# Patient Record
Sex: Male | Born: 1945 | Race: Black or African American | Hispanic: No | Marital: Married | Smoking: Current every day smoker
Health system: Southern US, Community
[De-identification: ages and names within clinical notes are randomized; demographics above are authoritative.]

## PROBLEM LIST (undated history)

## (undated) DIAGNOSIS — C801 Malignant (primary) neoplasm, unspecified: Secondary | ICD-10-CM

## (undated) DIAGNOSIS — I351 Nonrheumatic aortic (valve) insufficiency: Secondary | ICD-10-CM

## (undated) DIAGNOSIS — I639 Cerebral infarction, unspecified: Secondary | ICD-10-CM

## (undated) DIAGNOSIS — N189 Chronic kidney disease, unspecified: Secondary | ICD-10-CM

## (undated) DIAGNOSIS — D151 Benign neoplasm of heart: Secondary | ICD-10-CM

## (undated) DIAGNOSIS — E785 Hyperlipidemia, unspecified: Secondary | ICD-10-CM

## (undated) DIAGNOSIS — R079 Chest pain, unspecified: Secondary | ICD-10-CM

## (undated) DIAGNOSIS — K635 Polyp of colon: Secondary | ICD-10-CM

## (undated) DIAGNOSIS — E7800 Pure hypercholesterolemia, unspecified: Secondary | ICD-10-CM

## (undated) DIAGNOSIS — R011 Cardiac murmur, unspecified: Secondary | ICD-10-CM

## (undated) DIAGNOSIS — I00 Rheumatic fever without heart involvement: Secondary | ICD-10-CM

## (undated) DIAGNOSIS — I359 Nonrheumatic aortic valve disorder, unspecified: Secondary | ICD-10-CM

## (undated) DIAGNOSIS — M549 Dorsalgia, unspecified: Secondary | ICD-10-CM

## (undated) HISTORY — DX: Chest pain, unspecified: R07.9

## (undated) HISTORY — PX: OTHER SURGICAL HISTORY: SHX169

## (undated) HISTORY — DX: Dorsalgia, unspecified: M54.9

## (undated) HISTORY — DX: Pure hypercholesterolemia, unspecified: E78.00

## (undated) HISTORY — DX: Benign neoplasm of heart: D15.1

## (undated) HISTORY — PX: ESOPHAGOGASTRODUODENOSCOPY: SHX1529

## (undated) HISTORY — DX: Cerebral infarction, unspecified: I63.9

## (undated) HISTORY — DX: Hyperlipidemia, unspecified: E78.5

## (undated) HISTORY — DX: Nonrheumatic aortic valve disorder, unspecified: I35.9

---

## 1989-10-20 DIAGNOSIS — I639 Cerebral infarction, unspecified: Secondary | ICD-10-CM

## 1989-10-20 HISTORY — DX: Cerebral infarction, unspecified: I63.9

## 2001-02-08 ENCOUNTER — Encounter (INDEPENDENT_AMBULATORY_CARE_PROVIDER_SITE_OTHER): Payer: Self-pay | Admitting: Specialist

## 2001-02-08 ENCOUNTER — Other Ambulatory Visit: Admission: RE | Admit: 2001-02-08 | Discharge: 2001-02-08 | Payer: Self-pay | Admitting: Urology

## 2001-04-03 ENCOUNTER — Emergency Department (HOSPITAL_COMMUNITY): Admission: EM | Admit: 2001-04-03 | Discharge: 2001-04-03 | Payer: Self-pay

## 2005-06-24 ENCOUNTER — Emergency Department (HOSPITAL_COMMUNITY): Admission: EM | Admit: 2005-06-24 | Discharge: 2005-06-25 | Payer: Self-pay | Admitting: Emergency Medicine

## 2005-06-24 ENCOUNTER — Encounter (INDEPENDENT_AMBULATORY_CARE_PROVIDER_SITE_OTHER): Payer: Self-pay | Admitting: *Deleted

## 2005-06-24 ENCOUNTER — Ambulatory Visit (HOSPITAL_COMMUNITY): Admission: RE | Admit: 2005-06-24 | Discharge: 2005-06-24 | Payer: Self-pay | Admitting: Urology

## 2005-06-24 ENCOUNTER — Ambulatory Visit (HOSPITAL_BASED_OUTPATIENT_CLINIC_OR_DEPARTMENT_OTHER): Admission: RE | Admit: 2005-06-24 | Discharge: 2005-06-24 | Payer: Self-pay | Admitting: Urology

## 2005-10-30 ENCOUNTER — Ambulatory Visit (HOSPITAL_COMMUNITY): Admission: RE | Admit: 2005-10-30 | Discharge: 2005-10-30 | Payer: Self-pay | Admitting: Urology

## 2005-12-23 ENCOUNTER — Encounter (INDEPENDENT_AMBULATORY_CARE_PROVIDER_SITE_OTHER): Payer: Self-pay | Admitting: Specialist

## 2005-12-23 ENCOUNTER — Ambulatory Visit (HOSPITAL_BASED_OUTPATIENT_CLINIC_OR_DEPARTMENT_OTHER): Admission: RE | Admit: 2005-12-23 | Discharge: 2005-12-23 | Payer: Self-pay | Admitting: Urology

## 2008-07-07 ENCOUNTER — Encounter: Admission: RE | Admit: 2008-07-07 | Discharge: 2008-07-07 | Payer: Self-pay | Admitting: Neurology

## 2008-07-24 ENCOUNTER — Encounter: Payer: Self-pay | Admitting: Emergency Medicine

## 2008-07-24 ENCOUNTER — Observation Stay (HOSPITAL_COMMUNITY): Admission: AD | Admit: 2008-07-24 | Discharge: 2008-07-25 | Payer: Self-pay | Admitting: Internal Medicine

## 2008-08-24 ENCOUNTER — Encounter (INDEPENDENT_AMBULATORY_CARE_PROVIDER_SITE_OTHER): Payer: Self-pay | Admitting: Urology

## 2008-08-24 ENCOUNTER — Ambulatory Visit (HOSPITAL_COMMUNITY): Admission: RE | Admit: 2008-08-24 | Discharge: 2008-08-25 | Payer: Self-pay | Admitting: Urology

## 2008-12-27 IMAGING — CT CT ANGIO CHEST
1 of 3 series · 19 of 32 positions shown · IV contrast (APPLIED)
Comparison: None.

CTA CHEST

CLINICAL DATA: Chest and back pain.  Evaluate for aortic
dissection.

CT ANGIOGRAPHY CHEST AND ABDOMEN
TECHNIQUE: Multidetector CT imaging of the chest and abdomen was
performed using the standard protocol during bolus administration
of intravenous contrast. Multiplanar CT image reconstructions
including MIPs were obtained to evaluate the vascular anatomy.
Contrast: 100 ml [id number]

[Series 7: ?aortic dissecti 1.0 b20f st · axial · 0.69mm/px · z∈[-418,-28]mm · 19 of 434 slices shown]
[im 22/434  lung]
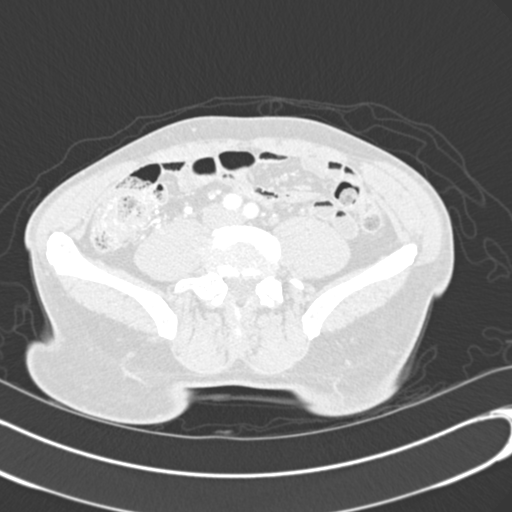
[im 44/434  soft-tissue]
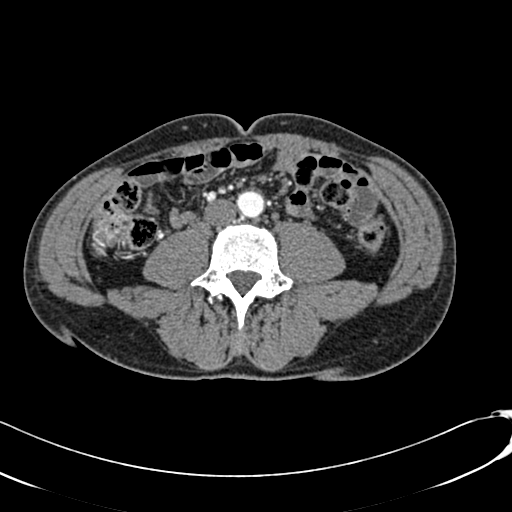
[im 65/434  lung]
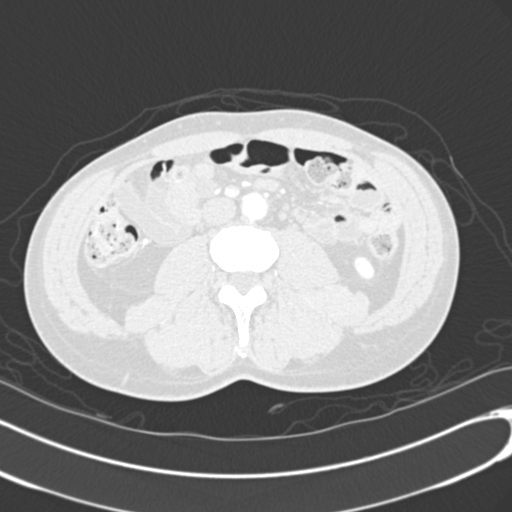
[im 87/434  soft-tissue]
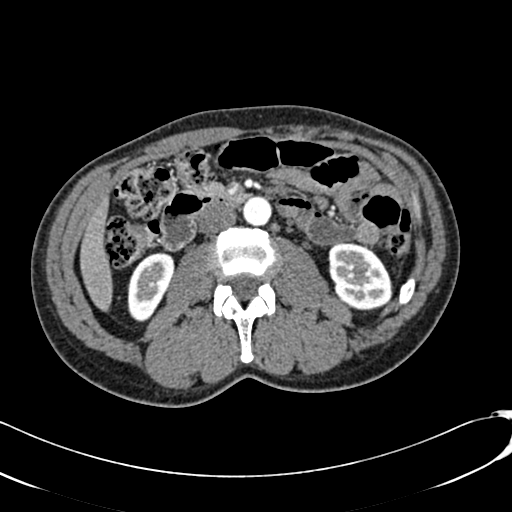
[im 109/434  lung]
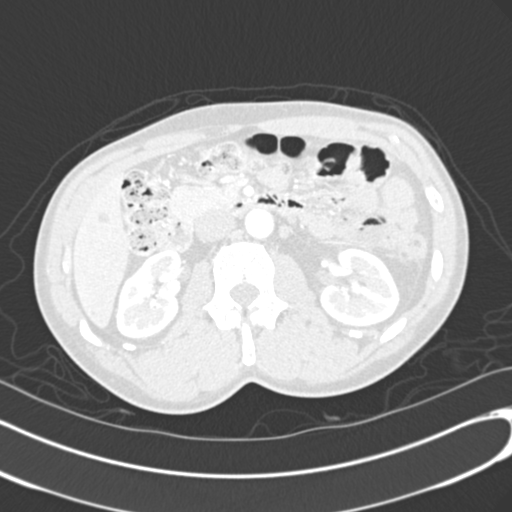
[im 130/434  soft-tissue]
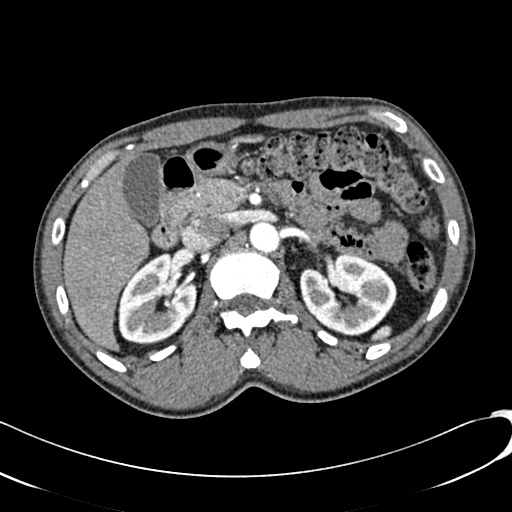
[im 152/434  lung]
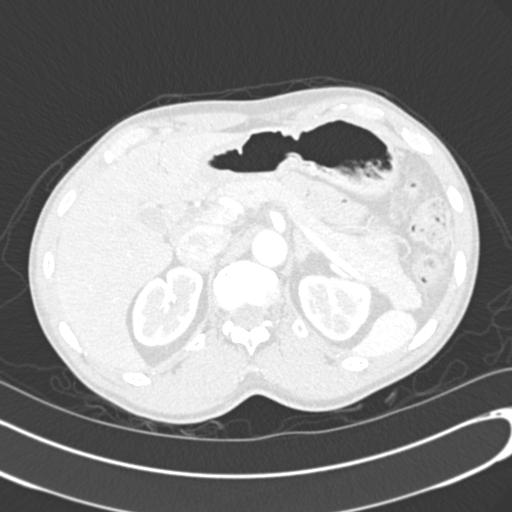
[im 174/434  soft-tissue]
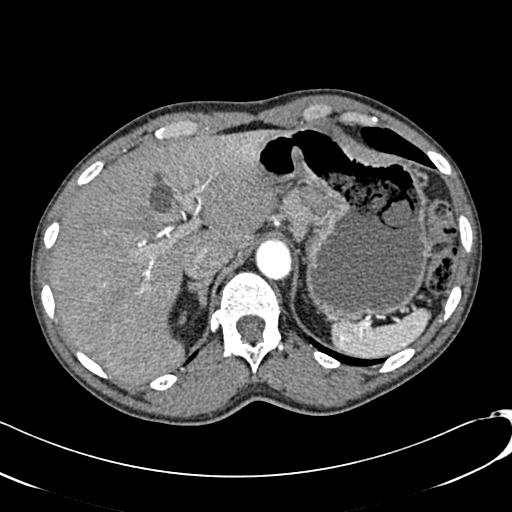
[im 195/434  lung]
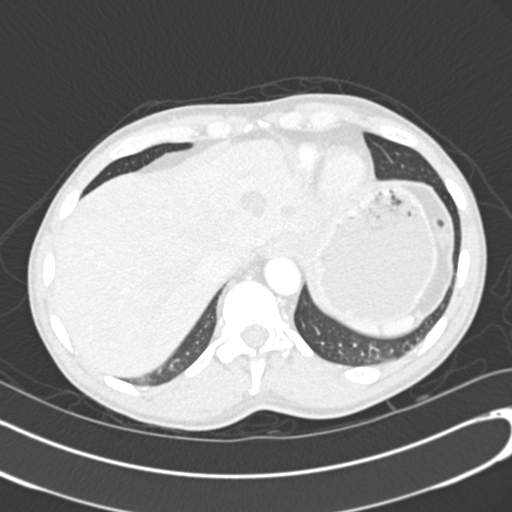
[im 217/434  soft-tissue]
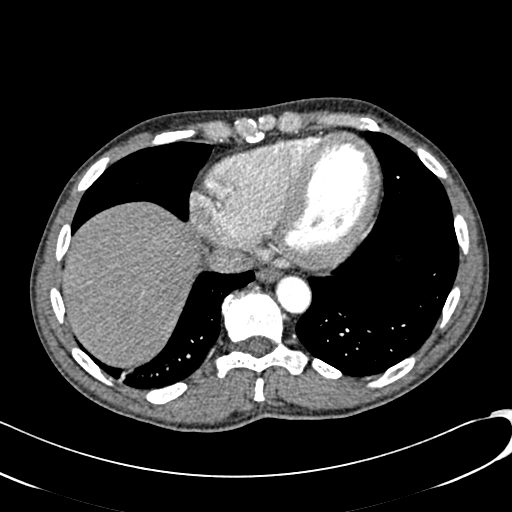
[im 239/434  lung]
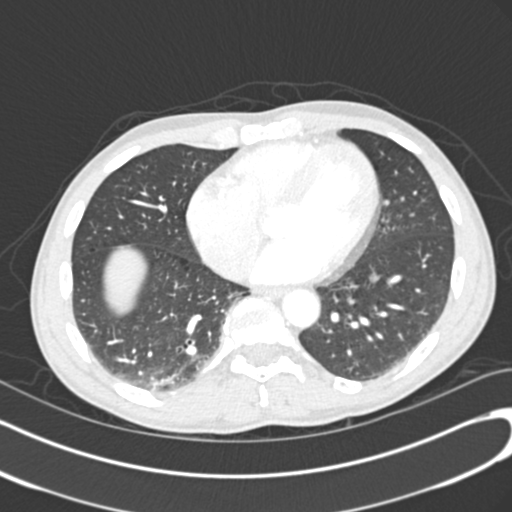
[im 260/434  soft-tissue]
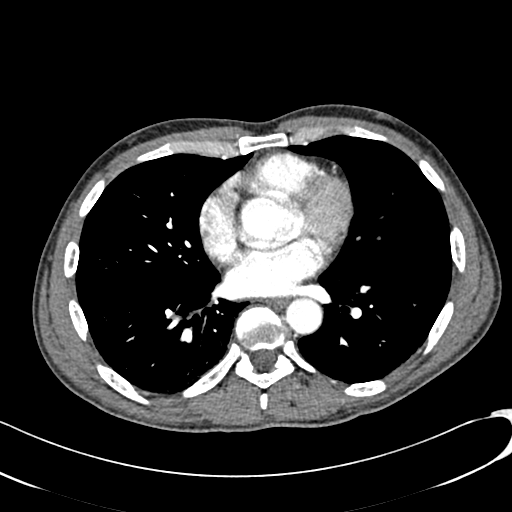
[im 282/434  lung]
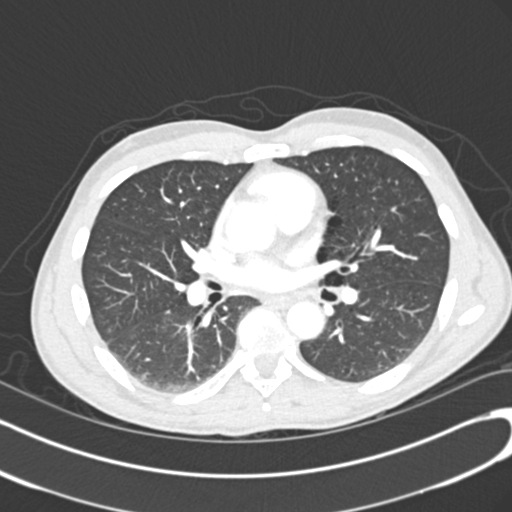
[im 304/434  soft-tissue]
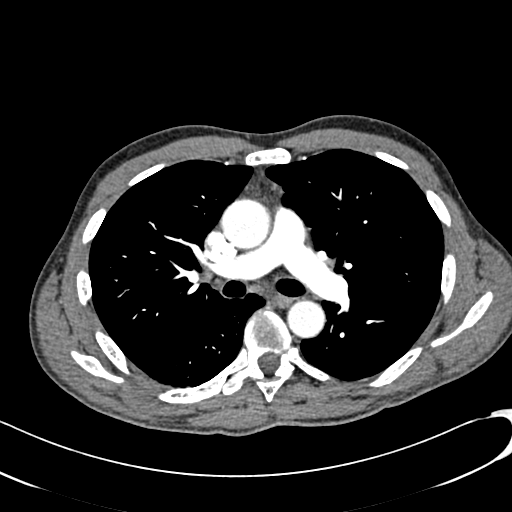
[im 325/434  lung]
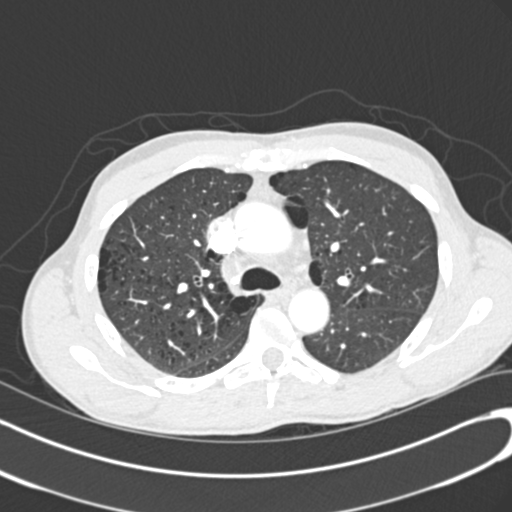
[im 347/434  soft-tissue]
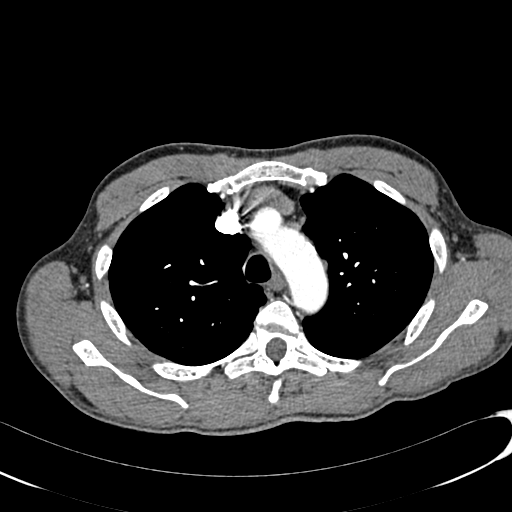
[im 369/434  lung]
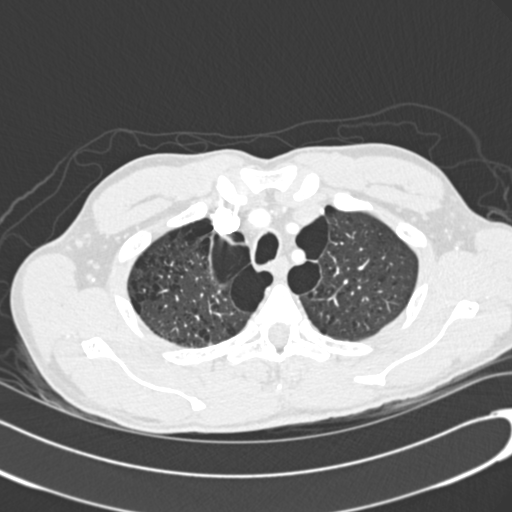
[im 390/434  soft-tissue]
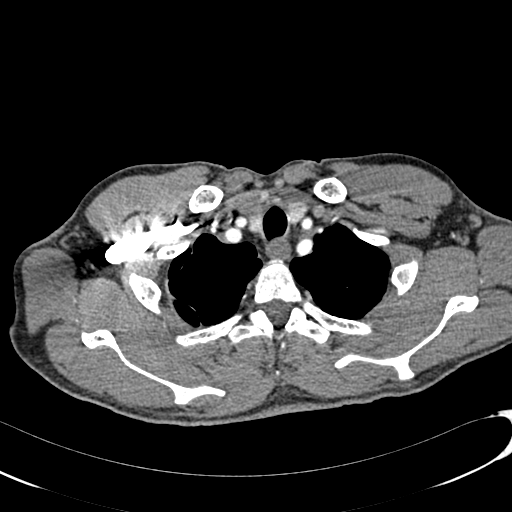
[im 412/434  lung]
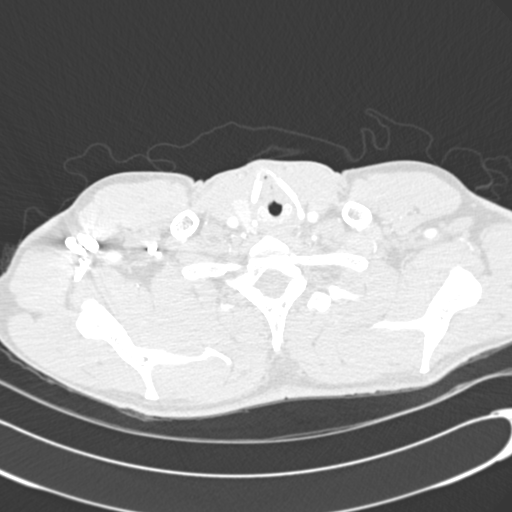

[19 of 32 positions shown; findings below may reference images not displayed]

FINDINGS: No evidence of aortic dissection.  No pathologically
enlarged mediastinal, hilar or axillary lymph nodes.  There are
calcified lymph nodes in the mediastinum.  Heart size within normal
limits.  No pericardial effusion.

Bullous emphysema.  Atelectasis and/or scarring in the right lower
lobe.  There is probable mucous within a bleb in the medial right
apex (image 27).  Lungs otherwise clear.  No pleural fluid.  Airway
unremarkable.
IMPRESSION: 1.  No evidence of aortic dissection.
2.  Bullous emphysema.

CTA ABDOMEN
FINDINGS: No evidence of aortic dissection or aneurysm.  Mild
atherosclerotic calcification is noted.

There are several well circumscribed low attenuation lesions in the
liver, measuring up to 1.9 x 1.7 cm.  These are likely cysts, in
the absence of known malignancy.  Right adrenal gland unremarkable.
Question mild thickening of the left adrenal gland.  There may be a
nonobstructing stone in the upper pole of the right kidney, which
is otherwise unremarkable.  Low attenuation lesion in the upper
pole of the left kidney measures 1.5 cm and is likely a cyst.
Spleen, pancreas and visualized bowel are unremarkable.

No pathologically enlarged lymph nodes.  No free fluid.  No
worrisome lytic or sclerotic lesions.
IMPRESSION: 1.  No evidence of aortic dissection.
2.  Question nonobstructing right renal stone.

## 2009-03-09 ENCOUNTER — Inpatient Hospital Stay (HOSPITAL_BASED_OUTPATIENT_CLINIC_OR_DEPARTMENT_OTHER): Admission: RE | Admit: 2009-03-09 | Discharge: 2009-03-09 | Payer: Self-pay | Admitting: Cardiology

## 2011-03-04 NOTE — Cardiovascular Report (Signed)
NAME:  Ricky Wagner, TOMB NO.:  [id number]   MEDICAL RECORD NO.:  [id number]          PATIENT TYPE:  OIB   LOCATION:  1963                         FACILITY:  [facility]   PHYSICIAN:  Jake Bathe, MD      DATE OF BIRTH:  1946-05-01   DATE OF PROCEDURE:  03/09/2009  DATE OF DISCHARGE:                            CARDIAC CATHETERIZATION   PROCEDURE:  1. Left heart catheterization.  2. Selective coronary angiography.  3. Left ventriculogram.   INDICATIONS:  Abnormal nuclear stress test.  Briefly, a 65 year old male  with abnormal nuclear stress test showing mild anterior septal wall  ischemia who has had a prior stroke on chronic Coumadin therapy (has  been held for 5 days) with hyperlipidemia, mild-to-moderate aortic  insufficiency, normal ejection fraction, exercise time 7 minutes and 45  seconds, here for cardiac catheterization.   PROCEDURE DETAILS:  Informed consent obtained.  Risk of stroke, heart  attack, death, renal impairment, bleeding, or arterial or venous damage  explained to the patient at length.  He was placed on the  catheterization table, prepped in a sterile fashion.  Lidocaine 1% was  used for local anesthesia.  Using the modified Seldinger technique, a 4-  French sheath was placed into the right femoral artery.  A Judkins left  #4 catheter and a no-torque Williams right catheter were used to  selectively cannulate the coronary arteries.  Multiple views with hand  injection of Omnipaque were obtained.  An angled pigtail was used to  cross the aortic valve into the left ventricle.  Hemodynamics obtained.  Left ventriculogram utilizing 30 mL of contrast in the RAO position was  performed.  Pullback was obtained.  Following the procedure, he was  hemodynamically stable and tolerated the procedure well.   FINDINGS:  1. Left main artery - branches into the circumflex and left anterior      descending artery.  There is no angiographically significant  coronary artery disease.  2. Left anterior descending artery - there are 2 diagonal branches,      small in caliber.  There is no angiographically significant      coronary artery disease present.  3. Circumflex artery - this vessel gives rise to 3 obtuse marginal      branches.  The first 2 are branching from a single point.  There is      no angiographically significant coronary artery disease present.  4. Right coronary artery.  This vessel is the dominant artery, giving      rise to the posterior descending artery.  There is no      angiographically significant coronary artery disease present.   Left ventriculogram - normal left ventricular ejection fraction of  approximately 55% with no significant mitral regurgitation present.  No  wall motion abnormalities.  There is no evidence of aortic stenosis from  pullback.  Left ventricular systolic pressure 133 with an end-diastolic  pressure of 13 mmHg.  Aortic pressure 133/64 with a mean of 93 mmHg.   IMPRESSION:  1. No angiographically significant coronary artery disease.  2. Normal left ventricular ejection fraction  of 55% with no wall      motion abnormalities.  3. No significant mitral regurgitation or aortic stenosis present.  On      echocardiogram, he did have mild-to-moderate aortic regurgitation.   PLAN:  Continue with current aggressive risk factor modification.  Reassuring cardiac catheterization.  False positive nuclear stress test.  He will resume Coumadin tomorrow night.  He is on chronic Coumadin  therapy.  He is seen by Dr. Sandria Manly, Neurology.      Jake Bathe, MD  Electronically Signed     MCS/MEDQ  D:  03/09/2009  T:  03/10/2009  Job:  [id number]   cc:   Juluis Rainier, M.D.  Genene Churn. Love, M.D.

## 2011-03-04 NOTE — Op Note (Signed)
NAME:  Ricky Wagner, Ricky Wagner               ACCOUNT NO.:  [id number]   MEDICAL RECORD NO.:  [id number]          PATIENT TYPE:  AMB   LOCATION:  DAY                          FACILITY:  [facility]   PHYSICIAN:  Jamison Neighbor, M.D.  DATE OF BIRTH:  1946/03/19   DATE OF PROCEDURE:  08/24/2008  DATE OF DISCHARGE:                               OPERATIVE REPORT   SERVICE:  Urology.   PREOPERATIVE DIAGNOSES:  1. BPH (benign prostatic hypertrophy) with bladder obstruction.  2. History of transitional cell carcinoma.   POSTOPERATIVE DIAGNOSES:  1. BPH (benign prostatic hypertrophy) with bladder obstruction.  2. History of transitional cell carcinoma.   PROCEDURE:  Cysto and gyrus saline TURP.   SURGEON:  Jamison Neighbor, M.D.   ANESTHESIA:  General.   COMPLICATIONS:  None.   DRAINS:  A 24 French Foley catheter.   HISTORY:  This 65 year old male has a history of BPH with bladder  obstruction.  Because he has been on Coumadin we have attempted to  control this with medical therapy.  In addition, he has a past history  of transitional cell carcinoma of the bladder.   The patient recently developed worsening problems with lower urinary  tract symptoms and he has had some hematuria.  He has had recent scans  that have not shown any evidence of upper tract disease.  His recent  cystoscopy showed no evidence of recurrent bladder cancer.  He did,  however, have significant enlarging prostate including the development  of a fairly prominent median lobe that is obstructing him in a ball  valve fashion.  The patient was advised as the various treatment  options.  We have elected the gyrus TURP because it should decrease his  risk for bleeding and allow him to get back on his Coumadin as quickly  as possible.  The patient understands the risks and benefits of the  procedure and gave full informed consent.   PROCEDURE IN DETAIL:  After successful induction of general anesthesia  the patient was placed  in the dorsal lithotomy position and prepped with  Betadine and draped in the usual sterile fashion.  Cystoscopy was  performed.  The urethra was visualized in its entirety and was found to  be normal.  Beyond the verumontanum there was lateral lobe hypertrophy  as well as a large median lobe that was flopping into the bladder.  The  bladder was carefully inspected.  No tumors or stones could be seen.  Both ureteral orifices were obscured by the median lobe but it should be  noted that they were evaluated at the end of the procedure and appeared  to be wide open.  The patient had no evidence of any bladder cancer  recurrence.  The urethra was then dilated to a 30 [place] with [employer].  The resectoscope was then inserted without difficulty.  The  gyrus saline resectoscope was then inserted.  The patient then underwent  saline TURP.  The median lobe was carefully taken down to the circular  fibers and extended all the way back to the verumontanum.  The  resectoscope was then used to take down the right lateral lobe beginning  at the 1 and 5 position extending down to the floor of the prostate.  The left lateral lobe was then taken in an identical fashion.  Vaporization did remove most if not all of the obstructing tissue.  A  followup rectal examination showed there was a little residual tissue  towards the apex and across the floor but the patient was wide open.  There was no bleeding and it appeared that the patient has had a  successful resection.  The bladder was not entered.  The bladder neck  had not been undermined.  A thickened neck appeared to be intact and the  ureters were not injured..  The patient had a few small chips that had  broken loose so these were sent for pathology but there was very little  tissue overall because the patient underwent vaporization.  The patient  tolerated the procedure and was taken to recovery in good condition.      Jamison Neighbor,  M.D.  Electronically Signed     RJE/MEDQ  D:  08/24/2008  T:  08/24/2008  Job:  [id number]

## 2011-03-04 NOTE — H&P (Signed)
NAME:  Ricky Wagner, Ricky Wagner               ACCOUNT NO.:  [id number]   MEDICAL RECORD NO.:  [id number]          PATIENT TYPE:  EMS   LOCATION:  ED                           FACILITY:  [facility]   PHYSICIAN:  Hollice Espy, M.D.DATE OF BIRTH:  07-Jan-1946   DATE OF ADMISSION:  07/24/2008  DATE OF DISCHARGE:                              HISTORY & PHYSICAL   PRIMARY CARE PHYSICIAN:  Dr. Camillo Flaming.   CHIEF COMPLAINT:  Chest and back pain.   HISTORY OF PRESENT ILLNESS:  The patient is a 65 year old African  American male with past medical history of CVA on chronic  anticoagulation and BPH who in the last day started complaining of some  severe atypical back pain which is new for him.  He describes this  specifically as bilateral lower back pain in the flank area.  He said  that he felt it continue and persist to the point where he could not  take anymore, and then today he started having some pain up in his chest  but actually more in the midepigastric area or so.  He felt this was  quite severe, would not  Relent, and so he came in to see his PCP.  His PCP evaluated the patient  and noted on lead V5 that he had some ST elevations.  She also noted  that his INR was subtherapeutic as he had skipped his dose yesterday.  With these findings, she sent him over to the emergency room.  In the  emergency room, he had another EKG done which actually showed  nonspecific normal sinus rhythm and that significant lead previously had  improved.  The rest of his labs were unremarkable.  A CT scan of the  abdomen and pelvis was done and is currently pending.  The patient  otherwise is doing well.  He denies any headaches, vision changes,  dysphagia, chest pain, palpitations, shortness of breath, wheeze, cough.  He is complaining of some mild midepigastric pain earlier, but that is  better.  No hematuria, dysuria, constipation, diarrhea.  He does  complain of continued lower back pain still.   REVIEW OF  SYSTEMS:  Otherwise negative.   PAST MEDICAL HISTORY:  Includes history of CVA on anticoagulation and  BPH.  He also has history of tobacco abuse.   MEDICATIONS:  1. Flomax 0.4 p.o. daily.  2. Avodart 0.5 p.o. daily.  3. Coumadin 7.5 mg daily except for Tuesdays when he takes 10.   ALLERGIES:  He has no known drug allergies.   SOCIAL HISTORY:  No alcohol or drug use.  He does smoke about 1 pack per  day.   FAMILY HISTORY:  Noncontributory.  Negative for CAD.   PHYSICAL EXAMINATION:  VITAL SIGNS:  On admission, temperature 99.2,  heart rate 87, blood pressure 124/59, respirations 18, O2 saturation 98%  on room air.  Please note that the patient since being in the emergency  room has spiked a temperature of 101.2.   LABORATORY WORK:  INR is subtherapeutic at 1.4, white count 9.1,  hemoglobin and hematocrit 14.2 and 42 respectively, MCV 92,  platelet  count 211, no shift.  Sodium 134, potassium 3.9, chloride 105, bicarb  22, BUN 9, creatinine 0.99, glucose 108.  UA notes trace ketones,  otherwise unremarkable.  CPK 82.7, MB less than 1, troponin-I less than  0.05.   ASSESSMENT AND PLAN:  1. Back pain, unclear etiology, may be musculoskeletal.  I do not      think this is pyelonephritis given his completely normal      urinalysis, await CT results.  2. Chest pain, more midepigastric in nature.  However, will check 2      more sets of cardiac markers.  3. History of cerebrovascular accident with subtherapeutic Coumadin.      Increase Coumadin dose to 10 tonight; recheck in the morning.  4. Benign prostatic hypertrophy, currently stable.  Continue      medications.  5. Fever, negative UA.  I will check a chest x-ray.      Hollice Espy, M.D.  Electronically Signed     SKK/MEDQ  D:  07/24/2008  T:  07/24/2008  Job:  [id number]   cc:   Juluis Rainier, M.D.

## 2011-03-07 NOTE — Op Note (Signed)
NAME:  Ricky Wagner, BEHNKEN               ACCOUNT NO.:  [id number]   MEDICAL RECORD NO.:  [id number]          PATIENT TYPE:  AMB   LOCATION:  [facility]                         FACILITY:  [facility]   PHYSICIAN:  Jamison Neighbor, M.D.  DATE OF BIRTH:  03-08-1946   DATE OF PROCEDURE:  06/24/2005  DATE OF DISCHARGE:                                 OPERATIVE REPORT   PREOPERATIVE DIAGNOSIS:  Transitional cell carcinoma.   POSTOPERATIVE DIAGNOSIS:  Transitional cell carcinoma.   OPERATION PERFORMED:  Cystoscopy, bilateral retrogrades, cold cup biopsy of  transitional cell carcinoma with cauterization of bladder base.   SURGEON:  Jamison Neighbor, M.D.\   ANESTHESIA:  General.   COMPLICATIONS:  None.   DRAINS:  None.   INDICATIONS FOR PROCEDURE:  This 65 year old male had recently developed  some problems with hematuria. He previously was evaluated for bladder  symptoms and was successfully treated first with medication and subsequently  with a TUNA procedure.  When the patient developed hematuria, he underwent  cystoscopy in the office and this revealed a small tumor on the right hand  side.  The patient's prostate was still somewhat enlarged but was no longer  obstructing. The patient has been advised to undergo removal of his tumor.  He understands the risks and benefits of the procedure and gave full  informed consent.   DESCRIPTION OF PROCEDURE:  After successful induction of general anesthesia,  the patient was placed in the dorsal lithotomy position, prepped with  Betadine and draped in the usual sterile fashion.  Cystoscopy was performed.  The urethra was visualized in its entirety and found to be normal.  Beyond  the verumontanum, the prostatic fossa was opened.  There was still some  residual prostatic tissue following a TUNA procedure but no real  obstruction.  The bladder itself was trabeculated.  Inspection with both 12  degree and 70 degree lenses showed that the single tumor that  had been seen  in the office was still present.  No other tumors were identified.  Retrograde studies were performed bilaterally.  Ureters were somewhat small  and there was some J hooking of the ureters from the previous prostatic  enlargement.  This did make it difficult to cannulate the ureters but they  were cannulated bilaterally and retrograde studies showed no abnormalities.  The upper tracts were identified on both right and left sides being  completely normal.  They were finally cupped.  The caliceal systems had no  dilation whatsoever, no filling defects were seen and drain out films were  normal and these were felt to be unremarkable retrogrades.  Attention was then directed to the tumor.  This was removed with a cold cup  biopsy.  Two additional biopsies of the base were sent as a separate  specimen.  The entire area was then cauterized.  The patient tolerated the  procedure well and was taken to the recovery room in good condition.           ______________________________  Jamison Neighbor, M.D.  Electronically Signed     RJE/MEDQ  D:  06/24/2005  T:  06/24/2005  Job:  [id number]   cc:   Dellis Anes. Idell Pickles, M.D.  [address]  [place]  [place] [address]  Fax: [phone]

## 2011-03-07 NOTE — Op Note (Signed)
NAME:  Ricky Wagner, Ricky Wagner               ACCOUNT NO.:  [id number]   MEDICAL RECORD NO.:  [id number]          PATIENT TYPE:  AMB   LOCATION:  [facility]                         FACILITY:  [facility]   PHYSICIAN:  Jamison Neighbor, M.D.  DATE OF BIRTH:  May 16, 1946   DATE OF PROCEDURE:  DATE OF DISCHARGE:                                 OPERATIVE REPORT   PREOPERATIVE DIAGNOSIS:  Bladder tumor with a small lesion noted on office  cystoscopy.   POSTOPERATIVE DIAGNOSIS:  Bladder tumor with a small lesion noted on office  cystoscopy.   PROCEDURE:  Cystoscopy, bladder biopsy, and fulguration.   ANESTHESIA:  General.   SURGEON:  Jamison Neighbor, M.D.   ASSISTANT:  Dr. Marcia Brash.   INDICATIONS:  This is a 65 year old gentleman who was seen and evaluated by  Dr. Logan Bores for a history of bladder TCC.  Upon followup in the office,  cystoscopy revealed a small bladder lesion.  Formal cystoscopy in the  operating room with fulguration was indicated.  The patient does have a  history of TUNA.   FINDINGS:  A small papillary bladder tumor was noted at the left upper wall  of the bladder.  The prostate was noted to be hypertrophied with a prominent  medial lobe with tissue that has regrown following TUNA.  The bladder was  noted to be trabeculated about +2.   DESCRIPTION OF PROCEDURE:  Patient was brought to the operating room.  General anesthesia was induced.  Preoperative antibiotics were given.  Patient was placed in the dorsal lithotomy position.  Prepped and draped in  the normal sterile fashion.  A time out was taken to properly identify the  patient and procedure to be done.  A 12 degree lens cystoscope was then used  to access the bladder.  The anterior and posterior urethral mucosa was noted  to be devoid of masses, lesions, or stones except the prostatic urethra,  where prostatic regrowth was noted on the median lobe of the prostate.  There were no suspicious masses noted.  Upon entering the bladder,  the  bladder was noted to be trabeculated at +2.  Advanced cystoscopy did not  reveal any masses, lesions or stones except for a small papillary lesion on  the left upper wall of the bladder.  This was adequately biopsied, and  hemostasis was obtained by fulgeration  At the end of the case, there was no  evidence of bladder trauma or bleeding.  The scope was then taken  up after the bladder was emptied.  At this point, the patient was taken down  , and procedure terminated with no complications.  The patient was extubated  in the operating room and taken in stable condition to PACU.   Dr. Logan Bores was present and participated in the entire procedure, as he was  the responsible surgeon.     ______________________________  Glade Nurse, MD      Jamison Neighbor, M.D.  Electronically Signed    MT/MEDQ  D:  12/23/2005  T:  12/23/2005  Job:  [id number]

## 2011-07-21 LAB — DIFFERENTIAL
Basophils Absolute: 0
Monocytes Absolute: 1.1 — ABNORMAL HIGH
Neutrophils Relative %: 66

## 2011-07-21 LAB — BASIC METABOLIC PANEL
BUN: 9
CO2: 23
Chloride: 105
Creatinine, Ser: 0.99
GFR calc Af Amer: >60
GFR calc non Af Amer: >60
Sodium: 134 — ABNORMAL LOW

## 2011-07-21 LAB — CBC
MCV: 92.7
Platelets: 211
RDW: 13.7
WBC: 9.1

## 2011-07-21 LAB — URINALYSIS, ROUTINE W REFLEX MICROSCOPIC
Bilirubin Urine: NEGATIVE
Glucose, UA: NEGATIVE
Nitrite: NEGATIVE
pH: 6

## 2011-07-22 LAB — HEMOGLOBIN AND HEMATOCRIT, BLOOD: Hemoglobin: 14.1

## 2011-07-22 LAB — BASIC METABOLIC PANEL
BUN: 8
Creatinine, Ser: 0.99
Glucose, Bld: 128 — ABNORMAL HIGH
Potassium: 3.8

## 2011-07-22 LAB — CBC
HCT: 36.3 — ABNORMAL LOW
Hemoglobin: 12.3 — ABNORMAL LOW

## 2011-07-22 LAB — CARDIAC PANEL(CRET KIN+CKTOT+MB+TROPI)
CK, MB: 0.8
CK, MB: 0.9
Total CK: 148
Troponin I: 0.01

## 2011-07-22 LAB — CULTURE, BLOOD (SINGLE)

## 2011-07-22 LAB — PROTIME-INR
INR: 1.5
INR: 1
Prothrombin Time: 18.5 — ABNORMAL HIGH
Prothrombin Time: 13.5

## 2011-07-22 LAB — GLUCOSE, CAPILLARY: Glucose-Capillary: 128 — ABNORMAL HIGH

## 2012-03-05 DIAGNOSIS — Z9119 Patient's noncompliance with other medical treatment and regimen: Secondary | ICD-10-CM | POA: Diagnosis not present

## 2012-03-05 DIAGNOSIS — I6789 Other cerebrovascular disease: Secondary | ICD-10-CM | POA: Diagnosis not present

## 2012-03-05 DIAGNOSIS — Z7901 Long term (current) use of anticoagulants: Secondary | ICD-10-CM | POA: Diagnosis not present

## 2012-03-09 DIAGNOSIS — Z7901 Long term (current) use of anticoagulants: Secondary | ICD-10-CM | POA: Diagnosis not present

## 2012-06-18 DIAGNOSIS — I359 Nonrheumatic aortic valve disorder, unspecified: Secondary | ICD-10-CM | POA: Diagnosis not present

## 2012-06-18 DIAGNOSIS — R002 Palpitations: Secondary | ICD-10-CM | POA: Diagnosis not present

## 2012-06-18 DIAGNOSIS — R0602 Shortness of breath: Secondary | ICD-10-CM | POA: Diagnosis not present

## 2012-06-18 DIAGNOSIS — I4949 Other premature depolarization: Secondary | ICD-10-CM | POA: Diagnosis not present

## 2012-07-06 DIAGNOSIS — R002 Palpitations: Secondary | ICD-10-CM | POA: Diagnosis not present

## 2012-07-06 DIAGNOSIS — I4949 Other premature depolarization: Secondary | ICD-10-CM | POA: Diagnosis not present

## 2012-07-06 DIAGNOSIS — I359 Nonrheumatic aortic valve disorder, unspecified: Secondary | ICD-10-CM | POA: Diagnosis not present

## 2012-07-06 DIAGNOSIS — R0602 Shortness of breath: Secondary | ICD-10-CM | POA: Diagnosis not present

## 2012-07-12 DIAGNOSIS — I359 Nonrheumatic aortic valve disorder, unspecified: Secondary | ICD-10-CM | POA: Diagnosis not present

## 2012-07-12 DIAGNOSIS — F17200 Nicotine dependence, unspecified, uncomplicated: Secondary | ICD-10-CM | POA: Diagnosis not present

## 2012-07-12 DIAGNOSIS — I059 Rheumatic mitral valve disease, unspecified: Secondary | ICD-10-CM | POA: Diagnosis not present

## 2012-09-06 DIAGNOSIS — Z7901 Long term (current) use of anticoagulants: Secondary | ICD-10-CM | POA: Diagnosis not present

## 2012-09-13 DIAGNOSIS — Z7901 Long term (current) use of anticoagulants: Secondary | ICD-10-CM | POA: Diagnosis not present

## 2012-09-13 DIAGNOSIS — J069 Acute upper respiratory infection, unspecified: Secondary | ICD-10-CM | POA: Diagnosis not present

## 2012-09-21 DIAGNOSIS — Z23 Encounter for immunization: Secondary | ICD-10-CM | POA: Diagnosis not present

## 2012-09-21 DIAGNOSIS — Z7901 Long term (current) use of anticoagulants: Secondary | ICD-10-CM | POA: Diagnosis not present

## 2013-01-05 DIAGNOSIS — I359 Nonrheumatic aortic valve disorder, unspecified: Secondary | ICD-10-CM | POA: Diagnosis not present

## 2013-01-10 DIAGNOSIS — I359 Nonrheumatic aortic valve disorder, unspecified: Secondary | ICD-10-CM | POA: Diagnosis not present

## 2013-01-10 DIAGNOSIS — I6789 Other cerebrovascular disease: Secondary | ICD-10-CM | POA: Diagnosis not present

## 2013-01-10 DIAGNOSIS — R011 Cardiac murmur, unspecified: Secondary | ICD-10-CM | POA: Diagnosis not present

## 2013-01-11 ENCOUNTER — Other Ambulatory Visit: Payer: Self-pay | Admitting: Cardiology

## 2013-01-11 NOTE — H&P (Signed)
Patient: Ricky Wagner, Ricky Wagner Account Number: [id number] Provider: Donato Schultz, MD  DOB: 08/13/1946 Age: 67 Y Sex: Male Date: 01/10/2013  Phone: [phone]   Address: [address] [place], [place], [place]  Pcp: ELIZABETH BARNES          1. FOLLOW UP & DISCUSS ECHO RESULTS.        HPI:  General:  67 year old with previously described moderate aortic regurgitation, MV mass here for discussion of TEE. 1. Normal LV size and function. 2. Left ventricular ejection fraction estimated by 2D at 55-60 percent. 3. There were no regional wall motion abnormalities. 4. Mildly thickened mitral valve. 5. Mild mitral valve regurgitation. 6. There is a small, sub-centimeter (0.7cm) round echo-density on the ventricular surface of the anterior leaflet of the mitral valve adjacent to the left ventricular outflow tract. Differential includes papillary fibroelastoma. I personally viewed prior echocardiogram from 2010 and do not see this structure fully defined. 7. Mild calcification of the aortic valve. 8. Moderate aortic valve regurgitation. Central jet. He has had a stroke in the distant past. This may also be just a serendipitous finding as well. He is noting SOB at times with activity and palpitations.  Prior CATH in 2010 with no significant CAD. .        ROS:  Very rare sensation of food getting hung up in the epigastric region. No bleeding, no syncope.       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 Sandria Manly), Hyperlipidemia, Tobacco use, Rheumatic fever - murmur, Aortic insufficiency - mild to moderate- ECHO 02/23/09. Normal ejection fraction, CATH - No CAD after Nuclear stress test-02/23/09-anteroseptal ischemia, abnormal, 7 minutes and 45 seconds (Skains), prostate cancer (Dr. Earlene Plater).        Surgical History: Prostate = TUR + Benign , Bladder = malignant neoplasm , EGD = normal 1998, colon = polyp 1998.        Family History: Father: deceased 51 yrs,  prostate cancer, colon carcinoma-The computer said father had prostate cancer, but patient said colon ???Mother: deceased 71 yrs, stomach cancerSiblings: 3 brothers, 3 sisters       Social History:  General:  History of smoking cigarettes: Current smoker, Frequency: 1 PPD.  Smoking: yes, 1 PPD for 40 years.  Alcohol: 1 beer per month.  Caffeine: yes.  no Recreational drug use.  Diet: no particular dietary program.  Exercise: regularly.  Occupation: employed, Electrical engineer.  Marital Status: married.  Children: 3.        Medications: Taking Metoprolol Succinate 25 mg 25 MG Tablet Extended Release 24 Hour 1 tablet Once a day, Taking Coumadin 7.5 MG Tablet 7.5 mg daily, Medication List reviewed and reconciled with the patient       Allergies: N.K.D.A.          Vitals: Wt 152, Wt change 3 lb, Ht 68, BMI 23.11, Pulse sitting 72, BP sitting 140/80.       Examination:  General Examination:  GENERAL APPEARANCE alert, oriented, NAD, pleasant.  SKIN: normal, no rash.  HEENT: normal, Brisk carotid upstroke.  HEAD: [place]/AT.  EYES: EOMI, Conjunctiva clear.  HEART: regular rate and rhythm, no S3, S4,frequent ectopy, 2/6 diastolic murmur at LUSB. Normal PMI.  NEUROLOGIC EXAM: non-focal exam, alert and oriented x 3.  PERIPHERAL PULSES: normal (2+) bilaterally.  PSYCH affect normal.  Echocardiogram reviewed.           Assessment:  1. Aortic valve disorders - 424.1 (  Primary)  2. CVA - 436, Previous stroke, may not have any correlation with possible papillary fibroelastostoma.  3. Heart Murmur - 785.2, Mitral valve mass noted. No significant change in 6 months.        1. Aortic valve disorders  LAB: PT (Prothrombin Time) (952841)    Prothrombin Time 16.5 H 9.1-12.0 - SEC   INR 1.6 H 0.8-1.2 -    LAB: Basic Metabolic    GLUCOSE 95  70-99 - mg/dL   BUN 16  3-24 - mg/dL   CREATININE 4.01  0.27-2.53 - mg/dl   eGFR (NON-AFRICAN AMERICAN) 74  >60 - calc   eGFR (AFRICAN  AMERICAN) 90  >60 - calc   SODIUM 141  136-145 - mmol/L   POTASSIUM 5.1  3.5-5.5 - mmol/L   CHLORIDE 112 H 98-107 - mmol/L   C02 25  22-32 - mmol/L   ANION GAP 9.4  6.0-20.0 - mmol/L   CALCIUM 9.2  8.6-10.3 - mg/dL   LAB: CBC with Diff    WBC 5.1  4.0-11.0 - K/ul   RBC 4.84  4.20-5.80 - M/uL   HGB 14.7  13.0-17.0 - g/dL   HCT 66.4  40.3-47.4 - %   MCH 30.3  27.0-33.0 - pg   MPV 9.4  7.5-10.7 - fL   MCV 91.8  80.0-94.0 - fL   MCHC 33.0  32.0-36.0 - g/dL   RDW 25.9  56.3-87.5 - %   NRBC# 0.00  -    PLT 214  150-400 - K/uL   NEUT % 52.5  43.3-71.9 - %   NRBC% 0.10  - %   LYMPH% 34.0  16.8-43.5 - %   MONO % 10.4  4.6-12.4 - %   EOS % 1.6  0.0-7.8 - %   BASO % 1.5 H 0.0-1.0 - %   NEUT # 2.7  1.9-7.2 - K/uL   LYMPH# 1.70  1.10-2.70 - K/uL   MONO # 0.5  0.3-0.8 - K/uL   EOS # 0.1  0.0-0.6 - K/uL   BASO # 0.1  0.0-0.1 - K/uL    Notes: Aortic valve appears to have moderate aortic regurgitation however I am concerned that it may be in fact more severe and would like further quantification with transesophageal echocardiogram. This will also allow [place] to evaluate his mitral valve leaflet mass. risks and benefits of transesophageal echocardiogram described including bleeding, esophageal damage. Willing to proceed. I may have a visit as well after TEE with cardiothoracic surgery to discuss possible aortic valve replacement, excision of mitral valve mass.        Procedures:  Venipuncture:  Venipuncture: Gasanova,Svetlana 01/10/2013 11:09:01 AM > , performed in right arm.           Procedure Codes: 64332 ECL BMP, 85025 ECL CBC PLATELET DIFF, 36415 BLOOD COLLECTION ROUTINE VENIPUNCTURE       Follow Up: after TEE        Care Plan Details:   Provider: Donato Schultz, MD  Patient: Mickey, Esguerra DOB: 12/18/1945 Date: 01/10/2013

## 2013-01-12 ENCOUNTER — Encounter (HOSPITAL_COMMUNITY)
Admission: RE | Disposition: A | Discharge status: Home / Self Care | Payer: Self-pay | Source: Ambulatory Visit | Attending: Cardiology

## 2013-01-12 ENCOUNTER — Ambulatory Visit (HOSPITAL_COMMUNITY)
Admission: RE | Admit: 2013-01-12 | Discharge: 2013-01-12 | Disposition: A | Discharge status: Home / Self Care | Payer: Medicare Other | Source: Ambulatory Visit | Attending: Cardiology | Admitting: Cardiology

## 2013-01-12 ENCOUNTER — Encounter (HOSPITAL_COMMUNITY): Payer: Self-pay | Admitting: Gastroenterology

## 2013-01-12 DIAGNOSIS — Q211 Atrial septal defect: Secondary | ICD-10-CM | POA: Insufficient documentation

## 2013-01-12 DIAGNOSIS — R011 Cardiac murmur, unspecified: Secondary | ICD-10-CM | POA: Insufficient documentation

## 2013-01-12 DIAGNOSIS — Z7901 Long term (current) use of anticoagulants: Secondary | ICD-10-CM | POA: Insufficient documentation

## 2013-01-12 DIAGNOSIS — D4989 Neoplasm of unspecified behavior of other specified sites: Secondary | ICD-10-CM | POA: Diagnosis not present

## 2013-01-12 DIAGNOSIS — E785 Hyperlipidemia, unspecified: Secondary | ICD-10-CM | POA: Diagnosis not present

## 2013-01-12 DIAGNOSIS — Z87891 Personal history of nicotine dependence: Secondary | ICD-10-CM | POA: Insufficient documentation

## 2013-01-12 DIAGNOSIS — I059 Rheumatic mitral valve disease, unspecified: Secondary | ICD-10-CM | POA: Diagnosis not present

## 2013-01-12 DIAGNOSIS — C61 Malignant neoplasm of prostate: Secondary | ICD-10-CM | POA: Insufficient documentation

## 2013-01-12 DIAGNOSIS — I359 Nonrheumatic aortic valve disorder, unspecified: Secondary | ICD-10-CM | POA: Diagnosis not present

## 2013-01-12 DIAGNOSIS — Z8551 Personal history of malignant neoplasm of bladder: Secondary | ICD-10-CM | POA: Insufficient documentation

## 2013-01-12 DIAGNOSIS — Q2111 Secundum atrial septal defect: Secondary | ICD-10-CM | POA: Insufficient documentation

## 2013-01-12 DIAGNOSIS — Z79899 Other long term (current) drug therapy: Secondary | ICD-10-CM | POA: Insufficient documentation

## 2013-01-12 DIAGNOSIS — Z86718 Personal history of other venous thrombosis and embolism: Secondary | ICD-10-CM | POA: Insufficient documentation

## 2013-01-12 HISTORY — DX: Cerebral infarction, unspecified: I63.9

## 2013-01-12 HISTORY — PX: TEE WITHOUT CARDIOVERSION: SHX5443

## 2013-01-12 HISTORY — DX: Cardiac murmur, unspecified: R01.1

## 2013-01-12 HISTORY — DX: Malignant (primary) neoplasm, unspecified: C80.1

## 2013-01-12 HISTORY — DX: Nonrheumatic aortic (valve) insufficiency: I35.1

## 2013-01-12 HISTORY — DX: Chronic kidney disease, unspecified: N18.9

## 2013-01-12 HISTORY — DX: Rheumatic fever without heart involvement: I00

## 2013-01-12 HISTORY — DX: Polyp of colon: K63.5

## 2013-01-12 SURGERY — ECHOCARDIOGRAM, TRANSESOPHAGEAL
Anesthesia: Moderate Sedation

## 2013-01-12 MED ORDER — FENTANYL CITRATE 0.05 MG/ML IJ SOLN
INTRAMUSCULAR | Status: DC | PRN
  Administered 2013-01-12 (×2): 25 ug via INTRAVENOUS

## 2013-01-12 MED ORDER — MIDAZOLAM HCL 5 MG/ML IJ SOLN
INTRAMUSCULAR | Status: AC
  Filled 2013-01-12: qty 2

## 2013-01-12 MED ORDER — FENTANYL CITRATE 0.05 MG/ML IJ SOLN
INTRAMUSCULAR | Status: AC
  Filled 2013-01-12: qty 2

## 2013-01-12 MED ORDER — SODIUM CHLORIDE 0.9 % IV SOLN: INTRAVENOUS | Status: DC

## 2013-01-12 MED ORDER — LIDOCAINE VISCOUS 2 % MT SOLN
OROMUCOSAL | Status: DC | PRN
  Administered 2013-01-12: 10 mL via OROMUCOSAL

## 2013-01-12 MED ORDER — LIDOCAINE VISCOUS 2 % MT SOLN
OROMUCOSAL | Status: AC
  Filled 2013-01-12: qty 15

## 2013-01-12 MED ORDER — MIDAZOLAM HCL 10 MG/2ML IJ SOLN
INTRAMUSCULAR | Status: DC | PRN
  Administered 2013-01-12 (×3): 2 mg via INTRAVENOUS

## 2013-01-12 NOTE — Progress Notes (Signed)
  Echocardiogram Echocardiogram Transesophageal has been performed.  Ricky Wagner 01/12/2013, 4:17 PM

## 2013-01-12 NOTE — H&P (View-Only) (Signed)
  Patient: Ricky Wagner, Ricky Wagner Account Number: [id number] Provider: Mark Skains, MD  DOB: 05/18/1946 Age: 66 Y Sex: Male Date: 01/10/2013  Phone: [phone]   Address: [address], [place], [place]  Pcp: ELIZABETH BARNES          1. 6MO FOLLOW UP & DISCUSS ECHO RESULTS.        HPI:  General:  66-year-old with previously described moderate aortic regurgitation, MV mass here for discussion of TEE. 1. Normal LV size and function. 2. Left ventricular ejection fraction estimated by 2D at 55-60 percent. 3. There were no regional wall motion abnormalities. 4. Mildly thickened mitral valve. 5. Mild mitral valve regurgitation. 6. There is a small, sub-centimeter (0.7cm) round echo-density on the ventricular surface of the anterior leaflet of the mitral valve adjacent to the left ventricular outflow tract. Differential includes papillary fibroelastoma. I personally viewed prior echocardiogram from 2010 and do not see this structure fully defined. 7. Mild calcification of the aortic valve. 8. Moderate aortic valve regurgitation. Central jet. He has had a stroke in the distant past. This may also be just a serendipitous finding as well. He is noting SOB at times with activity and palpitations.  Prior CATH in 2010 with no significant CAD. .        ROS:  Very rare sensation of food getting hung up in the epigastric region. No bleeding, no syncope.       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 Love), Hyperlipidemia, Tobacco use, Rheumatic fever - murmur, Aortic insufficiency - mild to moderate- ECHO 02/23/09. Normal ejection fraction, CATH - No CAD after Nuclear stress test-02/23/09-anteroseptal ischemia, abnormal, 7 minutes and 45 seconds (Skains), prostate cancer (Dr. Davis).        Surgical History: Prostate = TUR + Benign , Bladder = malignant neoplasm , EGD = normal 1998, colon = polyp 1998.        Family History: Father: deceased 85 yrs,  prostate cancer, colon carcinoma-The computer said father had prostate cancer, but patient said colon ???Mother: deceased 94 yrs, stomach cancerSiblings: 3 brothers, 3 sisters       Social History:  General:  History of smoking cigarettes: Current smoker, Frequency: 1 PPD.  Smoking: yes, 1 PPD for 40 years.  Alcohol: 1 beer per month.  Caffeine: yes.  no Recreational drug use.  Diet: no particular dietary program.  Exercise: regularly.  Occupation: employed, Director of security.  Marital Status: married.  Children: 3.        Medications: Taking Metoprolol Succinate 25 mg 25 MG Tablet Extended Release 24 Hour 1 tablet Once a day, Taking Coumadin 7.5 MG Tablet 7.5 mg daily, Medication List reviewed and reconciled with the patient       Allergies: N.Wagner.D.A.          Vitals: Wt 152, Wt change 3 lb, Ht 68, BMI 23.11, Pulse sitting 72, BP sitting 140/80.       Examination:  General Examination:  GENERAL APPEARANCE alert, oriented, NAD, pleasant.  SKIN: normal, no rash.  HEENT: normal, Brisk carotid upstroke.  HEAD: [place]/AT.  EYES: EOMI, Conjunctiva clear.  HEART: regular rate and rhythm, no S3, S4,frequent ectopy, 2/6 diastolic murmur at LUSB. Normal PMI.  NEUROLOGIC EXAM: non-focal exam, alert and oriented x 3.  PERIPHERAL PULSES: normal (2+) bilaterally.  PSYCH affect normal.  Echocardiogram reviewed.           Assessment:  1. Aortic valve disorders - 424.1 (  Primary)  2. CVA - 436, Previous stroke, may not have any correlation with possible papillary fibroelastostoma.  3. Heart Murmur - 785.2, Mitral valve mass noted. No significant change in 6 months.        1. Aortic valve disorders  LAB: PT (Prothrombin Time) (005199)    Prothrombin Time 16.5 H 9.1-12.0 - SEC   INR 1.6 H 0.8-1.2 -    LAB: Basic Metabolic    GLUCOSE 95  70-99 - mg/dL   BUN 16  6-26 - mg/dL   CREATININE 1.00  0.60-1.30 - mg/dl   eGFR (NON-AFRICAN AMERICAN) 74  >60 - calc   eGFR (AFRICAN  AMERICAN) 90  >60 - calc   SODIUM 141  136-145 - mmol/L   POTASSIUM 5.1  3.5-5.5 - mmol/L   CHLORIDE 112 H 98-107 - mmol/L   C02 25  22-32 - mmol/L   ANION GAP 9.4  6.0-20.0 - mmol/L   CALCIUM 9.2  8.6-10.3 - mg/dL   LAB: CBC with Diff    WBC 5.1  4.0-11.0 - Wagner/ul   RBC 4.84  4.20-5.80 - M/uL   HGB 14.7  13.0-17.0 - g/dL   HCT 44.4  39.0-52.0 - %   MCH 30.3  27.0-33.0 - pg   MPV 9.4  7.5-10.7 - fL   MCV 91.8  80.0-94.0 - fL   MCHC 33.0  32.0-36.0 - g/dL   RDW 14.5  11.5-15.5 - %   NRBC# 0.00  -    PLT 214  150-400 - Wagner/uL   NEUT % 52.5  43.3-71.9 - %   NRBC% 0.10  - %   LYMPH% 34.0  16.8-43.5 - %   MONO % 10.4  4.6-12.4 - %   EOS % 1.6  0.0-7.8 - %   BASO % 1.5 H 0.0-1.0 - %   NEUT # 2.7  1.9-7.2 - Wagner/uL   LYMPH# 1.70  1.10-2.70 - Wagner/uL   MONO # 0.5  0.3-0.8 - Wagner/uL   EOS # 0.1  0.0-0.6 - Wagner/uL   BASO # 0.1  0.0-0.1 - Wagner/uL    Notes: Aortic valve appears to have moderate aortic regurgitation however I am concerned that it may be in fact more severe and would like further quantification with transesophageal echocardiogram. This will also allow us to evaluate his mitral valve leaflet mass. risks and benefits of transesophageal echocardiogram described including bleeding, esophageal damage. Willing to proceed. I may have a visit as well after TEE with cardiothoracic surgery to discuss possible aortic valve replacement, excision of mitral valve mass.        Procedures:  Venipuncture:  Venipuncture: Gasanova,Svetlana 01/10/2013 11:09:01 AM > , performed in right arm.           Procedure Codes: 80048 ECL BMP, 85025 ECL CBC PLATELET DIFF, 36415 BLOOD COLLECTION ROUTINE VENIPUNCTURE       Follow Up: after TEE        Care Plan Details:   Provider: Mark Skains, MD  Patient: Kluver, Lash Wagner DOB: 11/21/1945 Date: 01/10/2013       

## 2013-01-12 NOTE — Op Note (Signed)
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 Leaflet 4) + bubble study, small PFO

## 2013-01-12 NOTE — Interval H&P Note (Signed)
History and Physical Interval Note:  01/12/2013 2:50 PM  Ricky Wagner  has presented today for surgery, with the diagnosis of evaluate aortic valve/mitral valve mass  The various methods of treatment have been discussed with the patient and family. After consideration of risks, benefits and other options for treatment, the patient has consented to  Procedure(s) with comments: TRANSESOPHAGEAL ECHOCARDIOGRAM (TEE) (N/A) - h&p in file-hope as a surgical intervention .  The patient's history has been reviewed, patient examined, no change in status, stable for surgery.  I have reviewed the patient's chart and labs.  Questions were answered to the patient's satisfaction.     [name]

## 2013-01-13 ENCOUNTER — Encounter (HOSPITAL_COMMUNITY): Payer: Self-pay | Admitting: Cardiology

## 2013-01-26 DIAGNOSIS — I059 Rheumatic mitral valve disease, unspecified: Secondary | ICD-10-CM | POA: Diagnosis not present

## 2013-01-26 DIAGNOSIS — I359 Nonrheumatic aortic valve disorder, unspecified: Secondary | ICD-10-CM | POA: Diagnosis not present

## 2013-03-15 DIAGNOSIS — Z7901 Long term (current) use of anticoagulants: Secondary | ICD-10-CM | POA: Diagnosis not present

## 2013-03-15 DIAGNOSIS — J329 Chronic sinusitis, unspecified: Secondary | ICD-10-CM | POA: Diagnosis not present

## 2013-05-04 DIAGNOSIS — Z7901 Long term (current) use of anticoagulants: Secondary | ICD-10-CM | POA: Diagnosis not present

## 2013-05-11 DIAGNOSIS — Z7901 Long term (current) use of anticoagulants: Secondary | ICD-10-CM | POA: Diagnosis not present

## 2013-05-25 ENCOUNTER — Other Ambulatory Visit: Payer: Self-pay

## 2013-06-01 DIAGNOSIS — Z7901 Long term (current) use of anticoagulants: Secondary | ICD-10-CM | POA: Diagnosis not present

## 2013-06-08 DIAGNOSIS — Z7901 Long term (current) use of anticoagulants: Secondary | ICD-10-CM | POA: Diagnosis not present

## 2013-06-16 DIAGNOSIS — Z7901 Long term (current) use of anticoagulants: Secondary | ICD-10-CM | POA: Diagnosis not present

## 2013-08-02 ENCOUNTER — Encounter: Payer: Self-pay | Admitting: Cardiology

## 2013-08-02 ENCOUNTER — Encounter: Payer: Self-pay | Admitting: *Deleted

## 2013-08-04 DIAGNOSIS — Z7901 Long term (current) use of anticoagulants: Secondary | ICD-10-CM | POA: Diagnosis not present

## 2013-08-05 ENCOUNTER — Ambulatory Visit (INDEPENDENT_AMBULATORY_CARE_PROVIDER_SITE_OTHER): Payer: Medicare Other | Admitting: Cardiology

## 2013-08-05 ENCOUNTER — Encounter: Payer: Self-pay | Admitting: Cardiology

## 2013-08-05 VITALS — BP 134/71 | HR 74 | Ht 68.0 in | Wt 150.2 lb

## 2013-08-05 DIAGNOSIS — I359 Nonrheumatic aortic valve disorder, unspecified: Secondary | ICD-10-CM | POA: Diagnosis not present

## 2013-08-05 DIAGNOSIS — I059 Rheumatic mitral valve disease, unspecified: Secondary | ICD-10-CM

## 2013-08-05 DIAGNOSIS — D151 Benign neoplasm of heart: Secondary | ICD-10-CM

## 2013-08-05 DIAGNOSIS — Z8673 Personal history of transient ischemic attack (TIA), and cerebral infarction without residual deficits: Secondary | ICD-10-CM

## 2013-08-05 HISTORY — DX: Benign neoplasm of heart: D15.1

## 2013-08-05 NOTE — Progress Notes (Signed)
[address], [address] [place], [place]  [address] Phone: [phone] Fax:  [phone]  Date:  08/05/2013   ID:  Ricky Wagner, Ricky Wagner 23-Aug-1946, MRN [id number]  PCP:  Gaye Alken, MD   History of Present Illness: Ricky Wagner is a 67 y.o. male with moderate aortic insufficiency seen on transesophageal echocardiogram 01/12/13 here for followup. 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  Feeling some palpitations. Takes breath away 2-3 times a week when sitting in easy chair. Second in duration. No syncope, no dizziness. 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 No other high-risk symptoms.  He still smoking. He does have the patches he is considering trying to quit.     Wt Readings from Last 3 Encounters:  08/05/13 150 lb 3.2 oz (68.13 kg)  01/12/13 152 lb (68.947 kg)  01/12/13 152 lb (68.947 kg)     Past Medical History  Diagnosis Date  . Stroke 1991    in 1991 (? 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 Sandria Manly)  . Cancer     Prostate cancer, neoplasm  . Rheumatic fever   . Aortic insufficiency   . Heart murmur     Rheumatic fever  . Chronic kidney disease     Kidnye stone s/p Lithotripsy  . Polyp of colon   . Hypercholesteremia   . Back pain   . Hyperlipidemia   . CVA (cerebral vascular accident)   . Aortic valve disorder     mild to moderate- ECHO 02/23/09. Normal ejection fraction  . Chest pain      No CAD after Nuclear stress test-02/23/09-anteroseptal ischemia, abnormal, 7 minutes and 45 seconds (Skains)  . 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 Colonsocopy    . Esophagogastroduodenoscopy    . Tee without cardioversion N/A 01/12/2013    Procedure: TRANSESOPHAGEAL ECHOCARDIOGRAM (TEE);  Surgeon: Donato Schultz, MD;  [facility] ENDOSCOPY;  Service: Cardiovascular;  Laterality: N/A;  h&p  in file-hope    Current Outpatient Prescriptions  Medication Sig Dispense Refill  . metoprolol succinate (TOPROL-XL) 25 MG 24 hr tablet Take 25 mg by mouth daily.      Marland Kitchen warfarin (COUMADIN) 7.5 MG tablet Take 7.5 mg by mouth daily.       No current facility-administered medications for this visit.    Allergies:   No Known Allergies  Social History:  The patient  reports that he has been smoking Cigarettes.  He has been smoking about 1.00 pack per day. He does not have any smokeless tobacco history on file. He reports that he drinks about 0.6 ounces of alcohol per week. He reports that he does not use illicit drugs.   ROS:  Please see the history of present illness.   No syncope, no bleeding, no orthopnea, no PND   All other systems reviewed and negative.   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 No evidence of stroke. Small 0.67cm. No recent CVA-like symptoms. 2. Tobacco use-counseling encouraged cessation. 3. Aortic insufficiency-moderate on TEE. 3/14. Clinically no change.  Signed, Donato Schultz, MD [facility]  08/05/2013 5:16 PM

## 2013-08-05 NOTE — Patient Instructions (Signed)
Your physician recommends that you continue on your current medications as directed. Please refer to the Current Medication list given to you today.  Your physician wants you to follow-up in: 6 months with Dr. Skains. You will receive a reminder letter in the mail two months in advance. If you don't receive a letter, please call our office to schedule the follow-up appointment.  

## 2013-08-25 ENCOUNTER — Other Ambulatory Visit: Payer: Self-pay

## 2013-09-01 DIAGNOSIS — Z23 Encounter for immunization: Secondary | ICD-10-CM | POA: Diagnosis not present

## 2013-09-13 DIAGNOSIS — R972 Elevated prostate specific antigen [PSA]: Secondary | ICD-10-CM | POA: Diagnosis not present

## 2013-09-13 DIAGNOSIS — R52 Pain, unspecified: Secondary | ICD-10-CM | POA: Diagnosis not present

## 2013-10-27 DIAGNOSIS — N200 Calculus of kidney: Secondary | ICD-10-CM | POA: Diagnosis not present

## 2013-10-27 DIAGNOSIS — R109 Unspecified abdominal pain: Secondary | ICD-10-CM | POA: Diagnosis not present

## 2013-10-27 DIAGNOSIS — C679 Malignant neoplasm of bladder, unspecified: Secondary | ICD-10-CM | POA: Diagnosis not present

## 2013-10-27 DIAGNOSIS — R141 Gas pain: Secondary | ICD-10-CM | POA: Diagnosis not present

## 2013-10-27 DIAGNOSIS — R972 Elevated prostate specific antigen [PSA]: Secondary | ICD-10-CM | POA: Diagnosis not present

## 2013-11-03 DIAGNOSIS — Z7901 Long term (current) use of anticoagulants: Secondary | ICD-10-CM | POA: Diagnosis not present

## 2013-11-03 DIAGNOSIS — I6789 Other cerebrovascular disease: Secondary | ICD-10-CM | POA: Diagnosis not present

## 2013-11-16 DIAGNOSIS — M25519 Pain in unspecified shoulder: Secondary | ICD-10-CM | POA: Diagnosis not present

## 2013-11-23 DIAGNOSIS — M25819 Other specified joint disorders, unspecified shoulder: Secondary | ICD-10-CM | POA: Diagnosis not present

## 2013-12-09 DIAGNOSIS — N410 Acute prostatitis: Secondary | ICD-10-CM | POA: Diagnosis not present

## 2013-12-09 DIAGNOSIS — N411 Chronic prostatitis: Secondary | ICD-10-CM | POA: Diagnosis not present

## 2013-12-21 DIAGNOSIS — M25819 Other specified joint disorders, unspecified shoulder: Secondary | ICD-10-CM | POA: Diagnosis not present

## 2013-12-22 DIAGNOSIS — R972 Elevated prostate specific antigen [PSA]: Secondary | ICD-10-CM | POA: Diagnosis not present

## 2013-12-22 DIAGNOSIS — R319 Hematuria, unspecified: Secondary | ICD-10-CM | POA: Diagnosis not present

## 2013-12-22 DIAGNOSIS — C679 Malignant neoplasm of bladder, unspecified: Secondary | ICD-10-CM | POA: Diagnosis not present

## 2013-12-22 DIAGNOSIS — N200 Calculus of kidney: Secondary | ICD-10-CM | POA: Diagnosis not present

## 2014-01-09 DIAGNOSIS — J310 Chronic rhinitis: Secondary | ICD-10-CM | POA: Diagnosis not present

## 2014-02-01 ENCOUNTER — Ambulatory Visit (INDEPENDENT_AMBULATORY_CARE_PROVIDER_SITE_OTHER): Payer: Medicare Other | Admitting: Cardiology

## 2014-02-01 ENCOUNTER — Encounter: Payer: Self-pay | Admitting: Cardiology

## 2014-02-01 VITALS — BP 150/77 | HR 69 | Ht 68.0 in | Wt 161.4 lb

## 2014-02-01 DIAGNOSIS — Z720 Tobacco use: Secondary | ICD-10-CM

## 2014-02-01 DIAGNOSIS — R5383 Other fatigue: Secondary | ICD-10-CM | POA: Diagnosis not present

## 2014-02-01 DIAGNOSIS — R5381 Other malaise: Secondary | ICD-10-CM

## 2014-02-01 DIAGNOSIS — I359 Nonrheumatic aortic valve disorder, unspecified: Secondary | ICD-10-CM | POA: Diagnosis not present

## 2014-02-01 DIAGNOSIS — R635 Abnormal weight gain: Secondary | ICD-10-CM | POA: Diagnosis not present

## 2014-02-01 DIAGNOSIS — F17200 Nicotine dependence, unspecified, uncomplicated: Secondary | ICD-10-CM | POA: Diagnosis not present

## 2014-02-01 DIAGNOSIS — I10 Essential (primary) hypertension: Secondary | ICD-10-CM

## 2014-02-01 MED ORDER — LISINOPRIL 10 MG PO TABS: 10.0000 mg | ORAL_TABLET | Freq: Every day | ORAL | Status: DC

## 2014-02-01 NOTE — Patient Instructions (Signed)
Your physician has recommended you make the following change in your medication:  1)STOP Metoprolol 2)START Lisinopril 10mg  daily  Your physician recommends that you return for lab work in: 2 months a bmet  Your physician has requested that you have an echocardiogram. Echocardiography is a painless test that uses sound waves to create images of your heart. It provides your doctor with information about the size and shape of your heart and how well your heart's chambers and valves are working. This procedure takes approximately one hour. There are no restrictions for this procedure.   Your physician recommends that you schedule a follow-up appointment in: 2 months

## 2014-02-01 NOTE — Progress Notes (Signed)
[place], [address] [place], [place]  [address] Phone: [phone] Fax:  [phone]  Date:  02/01/2014   ID:  Ricky Wagner, Ricky Wagner 05-10-1946, MRN [id number]  PCP:  Gerrit Heck, MD   History of Present Illness: Ricky Wagner is a 68 y.o. male with moderate aortic insufficiency seen on transesophageal echocardiogram 01/12/13 here for followup. 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  Feeling some palpitations. Takes breath away 2-3 times a week when sitting in easy chair. Second in duration. No syncope, no dizziness. 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 No other high-risk symptoms.  He still smoking. He does have the patches he is considering trying to quit.  He has gained approximately 10 pounds over the past 6 months. He thinks is because it is been more sedentary. No increase in shortness of breath. No chest pain.     Wt Readings from Last 3 Encounters:  02/01/14 161 lb 6.4 oz (73.211 kg)  08/05/13 150 lb 3.2 oz (68.13 kg)  01/12/13 152 lb (68.947 kg)     Past Medical History  Diagnosis Date  . Stroke 1991    in 1991 (? 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 Erling Cruz)  . Cancer     Prostate cancer, neoplasm  . Rheumatic fever   . Aortic insufficiency   . Heart murmur     Rheumatic fever  . Chronic kidney disease     Kidnye stone s/p Lithotripsy  . Polyp of colon   . Hypercholesteremia   . Back pain   . Hyperlipidemia   . CVA (cerebral vascular accident)   . Aortic valve disorder     mild to moderate- ECHO 02/23/09. Normal ejection fraction  . Chest pain      No CAD after Nuclear stress test-02/23/09-anteroseptal ischemia, abnormal, 7 minutes and 45 seconds (Savannaha Stonerock)  . 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 Colonsocopy    . Esophagogastroduodenoscopy    . Tee without  cardioversion N/A 01/12/2013    Procedure: TRANSESOPHAGEAL ECHOCARDIOGRAM (TEE);  Surgeon: Candee Furbish, MD;  Location: [facility] ENDOSCOPY;  Service: Cardiovascular;  Laterality: N/A;  h&p in file-hope    Current Outpatient Prescriptions  Medication Sig Dispense Refill  . metoprolol succinate (TOPROL-XL) 25 MG 24 hr tablet Take 25 mg by mouth daily.      Marland Kitchen warfarin (COUMADIN) 7.5 MG tablet Take 7.5 mg by mouth daily.       No current facility-administered medications for this visit.    Allergies:   No Known Allergies  Social History:  The patient  reports that he has been smoking Cigarettes.  He has been smoking about 1.00 pack per day. He does not have any smokeless tobacco history on file. He reports that he drinks about .6 ounces of alcohol per week. He reports that he does not use illicit drugs.   ROS:  Please see the history of present illness.   No syncope, no bleeding, no orthopnea, no PND mild peripheral neuropathy, fifth digit, feet.  All other systems reviewed and negative.   PHYSICAL EXAM: VS:  BP 150/77  Pulse 69  Ht 5\' 8"  (1.727 m)  Wt 161 lb 6.4 oz (73.211 kg)  BMI 24.55 kg/m2 Well nourished, well developed, in no acute distressThin HEENT: normal  Neck: no JVD Cardiac:  normal S1, S2; RRR; soft diastolic murmurUpper sternal border Lungs:  clear to auscultation bilaterally, no wheezing, rhonchi or rales Abd: soft, nontender, no hepatomegaly Ext: no edema Skin: warm and dry Neuro: no focal abnormalities noted     EKG: 02/01/14-sinus rhythm 69, borderline first degree AV block, PR interval 218 ms, left axis deviation. LVH.  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 No evidence of stroke. Small 0.67cm. No recent CVA-like symptoms. 2. Tobacco use-counseling encouraged cessation. 3. Aortic insufficiency-moderate on TEE. 3/14. It is been 1 year, repeat echocardiogram especially with weight gain. 4. Peripheral neuropathy-discuss with  Dr. Drema Dallas. 5. History of stroke-he has been on Coumadin for several years. 6. Fatigue-I will stop his metoprolol to see if this helps. If we need to restart a medication for his palpitations, we can give him diltiazem. 7. Weight gain-10 pounds. Increase activity. Decrease overall caloric intake. Monitor. 8. Hypertension-starting lisinopril 10 mg. Check basic metabolic profile in 2 months. See him back in 2 months.  Signed, Candee Furbish, MD [facility]  02/01/2014 3:23 PM

## 2014-02-13 ENCOUNTER — Encounter: Payer: Self-pay | Admitting: Internal Medicine

## 2014-02-13 ENCOUNTER — Ambulatory Visit (HOSPITAL_COMMUNITY): Payer: Medicare Other | Attending: Internal Medicine | Admitting: Cardiology

## 2014-02-13 DIAGNOSIS — I359 Nonrheumatic aortic valve disorder, unspecified: Secondary | ICD-10-CM | POA: Insufficient documentation

## 2014-02-13 NOTE — Progress Notes (Signed)
Echo performed. 

## 2014-03-29 ENCOUNTER — Other Ambulatory Visit (INDEPENDENT_AMBULATORY_CARE_PROVIDER_SITE_OTHER): Payer: Medicare Other

## 2014-03-29 DIAGNOSIS — I359 Nonrheumatic aortic valve disorder, unspecified: Secondary | ICD-10-CM

## 2014-03-29 LAB — BASIC METABOLIC PANEL
BUN: 13 mg/dL (ref 6–23)
CALCIUM: 8.9 mg/dL (ref 8.4–10.5)
CO2: 23 mEq/L (ref 19–32)
CREATININE: 1.1 mg/dL (ref 0.4–1.5)
Chloride: 111 mEq/L (ref 96–112)
GFR: 90.01 mL/min (ref >60.00)
Glucose, Bld: 91 mg/dL (ref 70–99)
Potassium: 3.8 mEq/L (ref 3.5–5.1)
Sodium: 139 mEq/L (ref 135–145)

## 2014-04-06 ENCOUNTER — Ambulatory Visit (INDEPENDENT_AMBULATORY_CARE_PROVIDER_SITE_OTHER): Payer: Medicare Other | Admitting: Cardiology

## 2014-04-06 ENCOUNTER — Encounter: Payer: Self-pay | Admitting: Cardiology

## 2014-04-06 VITALS — BP 132/75 | HR 73 | Ht 68.0 in | Wt 162.0 lb

## 2014-04-06 DIAGNOSIS — Z8673 Personal history of transient ischemic attack (TIA), and cerebral infarction without residual deficits: Secondary | ICD-10-CM | POA: Diagnosis not present

## 2014-04-06 DIAGNOSIS — I10 Essential (primary) hypertension: Secondary | ICD-10-CM

## 2014-04-06 DIAGNOSIS — D151 Benign neoplasm of heart: Secondary | ICD-10-CM | POA: Diagnosis not present

## 2014-04-06 DIAGNOSIS — I059 Rheumatic mitral valve disease, unspecified: Secondary | ICD-10-CM

## 2014-04-06 DIAGNOSIS — I359 Nonrheumatic aortic valve disorder, unspecified: Secondary | ICD-10-CM

## 2014-04-06 NOTE — Patient Instructions (Signed)
Your physician wants you to follow-up in: 1 year with Dr. Dawna Part will receive a reminder letter in the mail two months in advance. If you don't receive a letter, please call our office to schedule the follow-up appointment.   Your physician recommends that you continue on your current medications as directed. Please refer to the Current Medication list given to you today.

## 2014-04-06 NOTE — Progress Notes (Signed)
[facility]. [address] [place], [place]  [address] Phone: [phone] Fax:  [phone]  Date:  04/06/2014   ID:  Ora, Mcnatt [birth year], MRN [id number]  PCP:  Gerrit Heck, MD   History of Present Illness: Ricky Wagner is a 68 y.o. male with moderate aortic insufficiency seen on transesophageal echocardiogram 01/12/13 here for followup. 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  Mild shortness of breath with exertion. This is not new for him. He still continues to smoke. He still smoking. He does have the patches he is considering trying to quit.  He has gained approximately 10 pounds over the past 6 months. He thinks is because it is been more sedentary. No increase in shortness of breath. No chest pain.     Wt Readings from Last 3 Encounters:  04/06/14 162 lb (73.483 kg)  02/01/14 161 lb 6.4 oz (73.211 kg)  08/05/13 150 lb 3.2 oz (68.13 kg)     Past Medical History  Diagnosis Date  . Stroke 1991    in 1991 (? 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 Erling Cruz)  . Cancer     Prostate cancer, neoplasm  . Rheumatic fever   . Aortic insufficiency   . Heart murmur     Rheumatic fever  . Chronic kidney disease     Kidnye stone s/p Lithotripsy  . Polyp of colon   . Hypercholesteremia   . Back pain   . Hyperlipidemia   . CVA (cerebral vascular accident)   . Aortic valve disorder     mild to moderate- ECHO 02/23/09. Normal ejection fraction  . Chest pain      No CAD after Nuclear stress test-02/23/09-anteroseptal ischemia, abnormal, 7 minutes and 45 seconds ([place])  . 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 Colonsocopy    . Esophagogastroduodenoscopy    . Tee without cardioversion N/A 01/12/2013    Procedure: TRANSESOPHAGEAL ECHOCARDIOGRAM (TEE);  Surgeon: Candee Furbish, MD;  Location: [facility] ENDOSCOPY;  Service: Cardiovascular;  Laterality: N/A;  h&p in file-[facility]    Current Outpatient Prescriptions  Medication Sig Dispense Refill  . lisinopril (PRINIVIL,ZESTRIL) 10 MG tablet Take 1 tablet (10 mg total) by mouth daily.  30 tablet  11  . warfarin (COUMADIN) 7.5 MG tablet Take 7.5 mg by mouth daily.       No current facility-administered medications for this visit.    Allergies:   No Known Allergies  Social History:  The patient  reports that he has been smoking Cigarettes.  He has been smoking about 1.00 pack per day. He does not have any smokeless tobacco history on file. He reports that he drinks about .6 ounces of alcohol per week. He reports that he does not use illicit drugs.   ROS:  Please see the history of present illness.   No syncope, no bleeding, no orthopnea, no PND mild peripheral neuropathy, fifth digit, feet.  All other systems reviewed and negative.   PHYSICAL EXAM: VS:  BP 132/75  Pulse 73  Ht 5\' 8"  (1.727 m)  Wt 162 lb (73.483 kg)  BMI 24.64 kg/m2 Well nourished, well developed, in no acute distressThin HEENT: normal Neck: no JVD Cardiac:  normal S1, S2; RRR; soft diastolic murmurUpper sternal  border Lungs:  clear to auscultation bilaterally, no wheezing, rhonchi or rales Abd: soft, nontender, no hepatomegaly Ext: no edema Skin: warm and dry Neuro: no focal abnormalities noted     EKG: 02/01/14-sinus rhythm 69, borderline first degree AV block, PR interval 218 ms, left axis deviation. LVH.  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 No evidence of stroke. Small 0.67cm. No recent CVA-like symptoms. 2. Tobacco use-counseling encouraged cessation. 3. Aortic insufficiency-moderate on TEE. 3/14. It is been 1 year, repeat echocardiogram especially with weight gain. 4. Peripheral neuropathy-discuss with Dr. Drema Dallas. 5. History of stroke-he has been on Coumadin for several  years. 6. Fatigue-I stopped his metoprolol and this may have helped a little bit but he still has some fatigue. Continue to encourage exercise. If palpitations return, consider diltiazem. 7. Weight gain-10 pounds. Increase activity. Decrease overall caloric intake. Monitor. 8. Hypertension- lisinopril 10 mg. lab work excellent. No changes made. Blood pressure under much better control. Check basic metabolic profile in 2 months. See him back in 12 months.  Signed, Candee Furbish, MD [facility]  04/06/2014 1:46 PM

## 2014-05-19 DIAGNOSIS — F17200 Nicotine dependence, unspecified, uncomplicated: Secondary | ICD-10-CM | POA: Diagnosis not present

## 2014-05-19 DIAGNOSIS — R229 Localized swelling, mass and lump, unspecified: Secondary | ICD-10-CM | POA: Diagnosis not present

## 2014-05-19 DIAGNOSIS — Z7901 Long term (current) use of anticoagulants: Secondary | ICD-10-CM | POA: Diagnosis not present

## 2014-05-26 DIAGNOSIS — Z8673 Personal history of transient ischemic attack (TIA), and cerebral infarction without residual deficits: Secondary | ICD-10-CM | POA: Diagnosis not present

## 2014-05-26 DIAGNOSIS — Z7901 Long term (current) use of anticoagulants: Secondary | ICD-10-CM | POA: Diagnosis not present

## 2014-06-06 DIAGNOSIS — I6789 Other cerebrovascular disease: Secondary | ICD-10-CM | POA: Diagnosis not present

## 2014-06-06 DIAGNOSIS — Z7901 Long term (current) use of anticoagulants: Secondary | ICD-10-CM | POA: Diagnosis not present

## 2014-07-10 DIAGNOSIS — Z8673 Personal history of transient ischemic attack (TIA), and cerebral infarction without residual deficits: Secondary | ICD-10-CM | POA: Diagnosis not present

## 2014-07-10 DIAGNOSIS — Z7901 Long term (current) use of anticoagulants: Secondary | ICD-10-CM | POA: Diagnosis not present

## 2014-07-25 DIAGNOSIS — Z7901 Long term (current) use of anticoagulants: Secondary | ICD-10-CM | POA: Diagnosis not present

## 2014-07-25 DIAGNOSIS — J40 Bronchitis, not specified as acute or chronic: Secondary | ICD-10-CM | POA: Diagnosis not present

## 2014-07-28 DIAGNOSIS — Z7901 Long term (current) use of anticoagulants: Secondary | ICD-10-CM | POA: Diagnosis not present

## 2014-07-28 DIAGNOSIS — I639 Cerebral infarction, unspecified: Secondary | ICD-10-CM | POA: Diagnosis not present

## 2014-07-28 DIAGNOSIS — Z23 Encounter for immunization: Secondary | ICD-10-CM | POA: Diagnosis not present

## 2014-08-25 DIAGNOSIS — Z7901 Long term (current) use of anticoagulants: Secondary | ICD-10-CM | POA: Diagnosis not present

## 2014-08-25 DIAGNOSIS — I639 Cerebral infarction, unspecified: Secondary | ICD-10-CM | POA: Diagnosis not present

## 2014-10-11 DIAGNOSIS — Z7901 Long term (current) use of anticoagulants: Secondary | ICD-10-CM | POA: Diagnosis not present

## 2014-10-11 DIAGNOSIS — I639 Cerebral infarction, unspecified: Secondary | ICD-10-CM | POA: Diagnosis not present

## 2014-10-26 DIAGNOSIS — R05 Cough: Secondary | ICD-10-CM | POA: Diagnosis not present

## 2014-10-26 DIAGNOSIS — Z7901 Long term (current) use of anticoagulants: Secondary | ICD-10-CM | POA: Diagnosis not present

## 2014-10-31 DIAGNOSIS — Z7901 Long term (current) use of anticoagulants: Secondary | ICD-10-CM | POA: Diagnosis not present

## 2014-10-31 DIAGNOSIS — I359 Nonrheumatic aortic valve disorder, unspecified: Secondary | ICD-10-CM | POA: Diagnosis not present

## 2014-10-31 DIAGNOSIS — R05 Cough: Secondary | ICD-10-CM | POA: Diagnosis not present

## 2014-11-07 DIAGNOSIS — Z8673 Personal history of transient ischemic attack (TIA), and cerebral infarction without residual deficits: Secondary | ICD-10-CM | POA: Diagnosis not present

## 2014-11-07 DIAGNOSIS — R51 Headache: Secondary | ICD-10-CM | POA: Diagnosis not present

## 2014-11-07 DIAGNOSIS — Z7901 Long term (current) use of anticoagulants: Secondary | ICD-10-CM | POA: Diagnosis not present

## 2014-11-14 DIAGNOSIS — Z7901 Long term (current) use of anticoagulants: Secondary | ICD-10-CM | POA: Diagnosis not present

## 2014-11-14 DIAGNOSIS — Z8673 Personal history of transient ischemic attack (TIA), and cerebral infarction without residual deficits: Secondary | ICD-10-CM | POA: Diagnosis not present

## 2014-11-14 DIAGNOSIS — I359 Nonrheumatic aortic valve disorder, unspecified: Secondary | ICD-10-CM | POA: Diagnosis not present

## 2014-11-20 DIAGNOSIS — N400 Enlarged prostate without lower urinary tract symptoms: Secondary | ICD-10-CM | POA: Diagnosis not present

## 2014-11-20 DIAGNOSIS — C670 Malignant neoplasm of trigone of bladder: Secondary | ICD-10-CM | POA: Diagnosis not present

## 2014-11-20 DIAGNOSIS — N200 Calculus of kidney: Secondary | ICD-10-CM | POA: Diagnosis not present

## 2014-11-20 DIAGNOSIS — R972 Elevated prostate specific antigen [PSA]: Secondary | ICD-10-CM | POA: Diagnosis not present

## 2014-11-20 DIAGNOSIS — N281 Cyst of kidney, acquired: Secondary | ICD-10-CM | POA: Diagnosis not present

## 2014-11-20 DIAGNOSIS — R319 Hematuria, unspecified: Secondary | ICD-10-CM | POA: Diagnosis not present

## 2014-11-24 DIAGNOSIS — I359 Nonrheumatic aortic valve disorder, unspecified: Secondary | ICD-10-CM | POA: Diagnosis not present

## 2014-11-24 DIAGNOSIS — Z7901 Long term (current) use of anticoagulants: Secondary | ICD-10-CM | POA: Diagnosis not present

## 2014-12-08 DIAGNOSIS — I359 Nonrheumatic aortic valve disorder, unspecified: Secondary | ICD-10-CM | POA: Diagnosis not present

## 2014-12-08 DIAGNOSIS — Z7901 Long term (current) use of anticoagulants: Secondary | ICD-10-CM | POA: Diagnosis not present

## 2014-12-13 DIAGNOSIS — H40013 Open angle with borderline findings, low risk, bilateral: Secondary | ICD-10-CM | POA: Diagnosis not present

## 2015-01-02 DIAGNOSIS — Z7901 Long term (current) use of anticoagulants: Secondary | ICD-10-CM | POA: Diagnosis not present

## 2015-01-02 DIAGNOSIS — Z8673 Personal history of transient ischemic attack (TIA), and cerebral infarction without residual deficits: Secondary | ICD-10-CM | POA: Diagnosis not present

## 2015-01-16 DIAGNOSIS — H5051 Esophoria: Secondary | ICD-10-CM | POA: Diagnosis not present

## 2015-01-16 DIAGNOSIS — H527 Unspecified disorder of refraction: Secondary | ICD-10-CM | POA: Diagnosis not present

## 2015-01-16 DIAGNOSIS — H25813 Combined forms of age-related cataract, bilateral: Secondary | ICD-10-CM | POA: Diagnosis not present

## 2015-01-16 DIAGNOSIS — H40003 Preglaucoma, unspecified, bilateral: Secondary | ICD-10-CM | POA: Diagnosis not present

## 2015-01-16 DIAGNOSIS — H43813 Vitreous degeneration, bilateral: Secondary | ICD-10-CM | POA: Diagnosis not present

## 2015-01-22 DIAGNOSIS — H4011X2 Primary open-angle glaucoma, moderate stage: Secondary | ICD-10-CM | POA: Diagnosis not present

## 2015-01-22 DIAGNOSIS — H5051 Esophoria: Secondary | ICD-10-CM | POA: Diagnosis not present

## 2015-01-22 DIAGNOSIS — H527 Unspecified disorder of refraction: Secondary | ICD-10-CM | POA: Diagnosis not present

## 2015-01-22 DIAGNOSIS — H25813 Combined forms of age-related cataract, bilateral: Secondary | ICD-10-CM | POA: Diagnosis not present

## 2015-01-22 DIAGNOSIS — H43813 Vitreous degeneration, bilateral: Secondary | ICD-10-CM | POA: Diagnosis not present

## 2015-01-30 DIAGNOSIS — I359 Nonrheumatic aortic valve disorder, unspecified: Secondary | ICD-10-CM | POA: Diagnosis not present

## 2015-01-30 DIAGNOSIS — Z7901 Long term (current) use of anticoagulants: Secondary | ICD-10-CM | POA: Diagnosis not present

## 2015-02-07 DIAGNOSIS — J101 Influenza due to other identified influenza virus with other respiratory manifestations: Secondary | ICD-10-CM | POA: Diagnosis not present

## 2015-02-07 DIAGNOSIS — R509 Fever, unspecified: Secondary | ICD-10-CM | POA: Diagnosis not present

## 2015-02-13 DIAGNOSIS — J111 Influenza due to unidentified influenza virus with other respiratory manifestations: Secondary | ICD-10-CM | POA: Diagnosis not present

## 2015-02-20 DIAGNOSIS — H527 Unspecified disorder of refraction: Secondary | ICD-10-CM | POA: Diagnosis not present

## 2015-02-20 DIAGNOSIS — H5051 Esophoria: Secondary | ICD-10-CM | POA: Diagnosis not present

## 2015-02-20 DIAGNOSIS — H25813 Combined forms of age-related cataract, bilateral: Secondary | ICD-10-CM | POA: Diagnosis not present

## 2015-02-20 DIAGNOSIS — H4011X2 Primary open-angle glaucoma, moderate stage: Secondary | ICD-10-CM | POA: Diagnosis not present

## 2015-02-20 DIAGNOSIS — H43813 Vitreous degeneration, bilateral: Secondary | ICD-10-CM | POA: Diagnosis not present

## 2015-02-27 DIAGNOSIS — J069 Acute upper respiratory infection, unspecified: Secondary | ICD-10-CM | POA: Diagnosis not present

## 2015-02-27 DIAGNOSIS — R791 Abnormal coagulation profile: Secondary | ICD-10-CM | POA: Diagnosis not present

## 2015-02-27 DIAGNOSIS — Z7901 Long term (current) use of anticoagulants: Secondary | ICD-10-CM | POA: Diagnosis not present

## 2015-02-28 ENCOUNTER — Other Ambulatory Visit: Payer: Self-pay | Admitting: Cardiology

## 2015-02-28 DIAGNOSIS — Z8673 Personal history of transient ischemic attack (TIA), and cerebral infarction without residual deficits: Secondary | ICD-10-CM | POA: Diagnosis not present

## 2015-02-28 DIAGNOSIS — Z7901 Long term (current) use of anticoagulants: Secondary | ICD-10-CM | POA: Diagnosis not present

## 2015-03-07 DIAGNOSIS — Z8673 Personal history of transient ischemic attack (TIA), and cerebral infarction without residual deficits: Secondary | ICD-10-CM | POA: Diagnosis not present

## 2015-03-07 DIAGNOSIS — Z7901 Long term (current) use of anticoagulants: Secondary | ICD-10-CM | POA: Diagnosis not present

## 2015-04-30 ENCOUNTER — Other Ambulatory Visit: Payer: Self-pay | Admitting: Cardiology

## 2015-05-23 ENCOUNTER — Other Ambulatory Visit: Payer: Self-pay

## 2015-05-23 MED ORDER — LISINOPRIL 10 MG PO TABS: ORAL_TABLET | ORAL | Status: DC

## 2015-06-04 ENCOUNTER — Ambulatory Visit: Payer: Medicare Other | Admitting: Cardiology

## 2015-06-12 ENCOUNTER — Ambulatory Visit (INDEPENDENT_AMBULATORY_CARE_PROVIDER_SITE_OTHER): Payer: Medicare Other | Admitting: Cardiology

## 2015-06-12 ENCOUNTER — Encounter: Payer: Self-pay | Admitting: Cardiology

## 2015-06-12 VITALS — BP 116/74 | HR 84 | Ht 68.0 in | Wt 158.5 lb

## 2015-06-12 DIAGNOSIS — Z8673 Personal history of transient ischemic attack (TIA), and cerebral infarction without residual deficits: Secondary | ICD-10-CM

## 2015-06-12 DIAGNOSIS — I059 Rheumatic mitral valve disease, unspecified: Secondary | ICD-10-CM | POA: Diagnosis not present

## 2015-06-12 DIAGNOSIS — I359 Nonrheumatic aortic valve disorder, unspecified: Secondary | ICD-10-CM | POA: Diagnosis not present

## 2015-06-12 DIAGNOSIS — D151 Benign neoplasm of heart: Secondary | ICD-10-CM

## 2015-06-12 NOTE — Progress Notes (Signed)
[facility]. [address], [address] [place], [place]  [address] Phone: [phone] Fax:  [phone]  Date:  06/12/2015   ID:  Ricky, Wagner [birth year], MRN [id number]  PCP:  Gerrit Heck, MD   History of Present Illness: Ricky Wagner is a 69 y.o. male with moderate aortic insufficiency seen on transesophageal echocardiogram 01/12/13 and Transthoracic ECHO 02/13/14 - here for followup. 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  He had a stroke in the 1990s, early 1990s, Dr. love took care of him, vertebral artery location. He was on chronic Coumadin since then. He remembers Dr. love telling him that he would consider stopping the Coumadin if he stopped smoking. He has not shown any signs of atrial fibrillation since I've known him.  Mild shortness of breath with exertion. This is not new for him. He still continues to smoke.  He does have the patches he is considering trying to quit.  No chest pain.     Wt Readings from Last 3 Encounters:  06/12/15 158 lb 8 oz (71.895 kg)  04/06/14 162 lb (73.483 kg)  02/01/14 161 lb 6.4 oz (73.211 kg)     Past Medical History  Diagnosis Date  . Stroke 1991    in 1991 (? 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 Erling Cruz)  . Cancer     Prostate cancer, neoplasm  . Rheumatic fever   . Aortic insufficiency   . Heart murmur     Rheumatic fever  . Chronic kidney disease     Kidnye stone s/p Lithotripsy  . Polyp of colon   . Hypercholesteremia   . Back pain   . Hyperlipidemia   . CVA (cerebral vascular accident)   . Aortic valve disorder     mild to moderate- ECHO 02/23/09. Normal ejection fraction  . Chest pain      No CAD after Nuclear stress test-02/23/09-anteroseptal ischemia, abnormal, 7 minutes and 45 seconds ([place])  . 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 Colonsocopy    . Esophagogastroduodenoscopy    . Tee without cardioversion N/A 01/12/2013    Procedure: TRANSESOPHAGEAL ECHOCARDIOGRAM (TEE);  Surgeon: Candee Furbish, MD;  Location[facility] ENDOSCOPY;  Service: Cardiovascular;  Laterality: N/A;  h&p in file-[facility]    Current Outpatient Prescriptions  Medication Sig Dispense Refill  . lisinopril (PRINIVIL,ZESTRIL) 10 MG tablet TAKE 1 TABLET (10 MG TOTAL) BY MOUTH DAILY. 90 tablet 0  . LUMIGAN 0.01 % SOLN     . warfarin (COUMADIN) 5 MG tablet     . warfarin (COUMADIN) 7.5 MG tablet Take 7.5 mg by mouth daily.     No current facility-administered medications for this visit.    Allergies:   No Known Allergies  Social History:  The patient  reports that he has been smoking Cigarettes.  He has been smoking about 1.00 pack per day. He does not have any smokeless tobacco history on file. He reports that he drinks about 0.6 oz of alcohol per week. He reports that he does not use illicit drugs.   ROS:  Please see the history of present illness.   No syncope, no bleeding, no orthopnea, no PND mild peripheral neuropathy, fifth digit, feet.  All other systems reviewed and negative.  PHYSICAL EXAM: VS:  BP 116/74 mmHg  Pulse 84  Ht 5\' 8"  (1.727 m)  Wt 158 lb 8 oz (71.895 kg)  BMI 24.11 kg/m2 Well nourished, well developed, in no acute distressThin HEENT: normal Neck: no JVD Cardiac:  normal S1, S2; RRR; soft diastolic murmur (very challenging to hear)Upper sternal border Lungs:  clear to auscultation bilaterally, no wheezing, rhonchi or rales Abd: soft, nontender, no hepatomegaly Ext: no edema Skin: warm and dry Neuro: no focal abnormalities noted     EKG: Today 06/12/15-sinus rhythm, 84, left axis deviation, nonspecific ST-T wave changes personally viewed-prior 02/01/14-sinus rhythm 69, borderline first degree AV block, PR interval 218 ms, left axis deviation. LVH.  ECHO: 02/13/14- Normal EF, moderate aortic  regurgitation-stable from prior echocardiogram.  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 No evidence of recent stroke. Small 0.67cm. No recent CVA-like symptoms. 2. Tobacco use-counseling encouraged cessation. 3. Aortic insufficiency-moderate on TEE. 3/14 as well as echo 4/15.  Repeat ECHO. 4. History of stroke-he has been on Coumadin for several years. I wonder if at this point the risk of bleeding outweigh the benefit of Coumadin. I have asked him to discuss this with Dr. Drema Dallas for potentially referring him back to neurology since Dr. Erling Cruz has now retired to review his case and to consider discontinuation of Coumadin if possible. 5. Fatigue-I stopped his metoprolol and this may have helped a little bit but he still has some fatigue. Continue to encourage exercise. If palpitations return, consider diltiazem. 6. Weight gain-10 pounds last year now stable.  Increase activity. Decrease overall caloric intake. Monitor. 7. Hypertension- lisinopril 10 mg. lab work excellent. No changes made. Blood pressure under much better control. See him back in 12 months.  Signed, Candee Furbish, MD [facility]  06/12/2015 10:21 AM

## 2015-06-12 NOTE — Patient Instructions (Signed)
Medication Instructions:  Your physician recommends that you continue on your current medications as directed. Please refer to the Current Medication list given to you today.  Testing/Procedures: Your physician has requested that you have an echocardiogram. Echocardiography is a painless test that uses sound waves to create images of your heart. It provides your doctor with information about the size and shape of your heart and how well your heart's chambers and valves are working. This procedure takes approximately one hour. There are no restrictions for this procedure.  Follow-Up: Follow up in 1 year with Dr. Marlou Porch.  You will receive a letter in the mail 2 months before you are due.  Please call us when you receive this letter to schedule your follow up appointment.  Thank you for choosing [facility]!!

## 2015-06-19 ENCOUNTER — Other Ambulatory Visit: Payer: Self-pay

## 2015-06-19 ENCOUNTER — Ambulatory Visit (HOSPITAL_COMMUNITY): Payer: Medicare Other | Attending: Cardiology

## 2015-06-19 DIAGNOSIS — I359 Nonrheumatic aortic valve disorder, unspecified: Secondary | ICD-10-CM

## 2015-06-19 DIAGNOSIS — I351 Nonrheumatic aortic (valve) insufficiency: Secondary | ICD-10-CM | POA: Diagnosis not present

## 2015-06-19 DIAGNOSIS — Z8249 Family history of ischemic heart disease and other diseases of the circulatory system: Secondary | ICD-10-CM | POA: Insufficient documentation

## 2015-06-19 DIAGNOSIS — I071 Rheumatic tricuspid insufficiency: Secondary | ICD-10-CM | POA: Diagnosis not present

## 2015-06-19 DIAGNOSIS — F17200 Nicotine dependence, unspecified, uncomplicated: Secondary | ICD-10-CM | POA: Insufficient documentation

## 2015-06-19 DIAGNOSIS — I358 Other nonrheumatic aortic valve disorders: Secondary | ICD-10-CM | POA: Diagnosis present

## 2015-06-20 DIAGNOSIS — Z7901 Long term (current) use of anticoagulants: Secondary | ICD-10-CM | POA: Diagnosis not present

## 2015-06-20 DIAGNOSIS — I359 Nonrheumatic aortic valve disorder, unspecified: Secondary | ICD-10-CM | POA: Diagnosis not present

## 2015-07-20 DIAGNOSIS — Z86010 Personal history of colonic polyps: Secondary | ICD-10-CM | POA: Diagnosis not present

## 2015-07-20 DIAGNOSIS — D649 Anemia, unspecified: Secondary | ICD-10-CM | POA: Diagnosis not present

## 2015-07-20 DIAGNOSIS — Z7901 Long term (current) use of anticoagulants: Secondary | ICD-10-CM | POA: Diagnosis not present

## 2015-07-27 DIAGNOSIS — Z7901 Long term (current) use of anticoagulants: Secondary | ICD-10-CM | POA: Diagnosis not present

## 2015-07-27 DIAGNOSIS — I359 Nonrheumatic aortic valve disorder, unspecified: Secondary | ICD-10-CM | POA: Diagnosis not present

## 2015-07-27 DIAGNOSIS — D649 Anemia, unspecified: Secondary | ICD-10-CM | POA: Diagnosis not present

## 2015-08-01 DIAGNOSIS — D509 Iron deficiency anemia, unspecified: Secondary | ICD-10-CM | POA: Diagnosis not present

## 2015-08-01 DIAGNOSIS — Z86010 Personal history of colonic polyps: Secondary | ICD-10-CM | POA: Diagnosis not present

## 2015-08-01 DIAGNOSIS — Z7901 Long term (current) use of anticoagulants: Secondary | ICD-10-CM | POA: Diagnosis not present

## 2015-08-03 DIAGNOSIS — Z23 Encounter for immunization: Secondary | ICD-10-CM | POA: Diagnosis not present

## 2015-08-08 DIAGNOSIS — H401111 Primary open-angle glaucoma, right eye, mild stage: Secondary | ICD-10-CM | POA: Diagnosis not present

## 2015-08-20 DIAGNOSIS — Z7901 Long term (current) use of anticoagulants: Secondary | ICD-10-CM | POA: Diagnosis not present

## 2015-08-27 ENCOUNTER — Other Ambulatory Visit: Payer: Self-pay | Admitting: Gastroenterology

## 2015-08-27 DIAGNOSIS — K3189 Other diseases of stomach and duodenum: Secondary | ICD-10-CM | POA: Diagnosis not present

## 2015-08-27 DIAGNOSIS — D124 Benign neoplasm of descending colon: Secondary | ICD-10-CM | POA: Diagnosis not present

## 2015-08-27 DIAGNOSIS — D62 Acute posthemorrhagic anemia: Secondary | ICD-10-CM | POA: Diagnosis not present

## 2015-08-27 DIAGNOSIS — K5730 Diverticulosis of large intestine without perforation or abscess without bleeding: Secondary | ICD-10-CM | POA: Diagnosis not present

## 2015-08-27 DIAGNOSIS — D126 Benign neoplasm of colon, unspecified: Secondary | ICD-10-CM | POA: Diagnosis not present

## 2015-08-27 DIAGNOSIS — Z09 Encounter for follow-up examination after completed treatment for conditions other than malignant neoplasm: Secondary | ICD-10-CM | POA: Diagnosis not present

## 2015-08-27 DIAGNOSIS — Z86010 Personal history of colonic polyps: Secondary | ICD-10-CM | POA: Diagnosis not present

## 2015-08-27 DIAGNOSIS — K5520 Angiodysplasia of colon without hemorrhage: Secondary | ICD-10-CM | POA: Diagnosis not present

## 2015-09-20 DIAGNOSIS — D649 Anemia, unspecified: Secondary | ICD-10-CM | POA: Diagnosis not present

## 2015-09-20 DIAGNOSIS — Z7901 Long term (current) use of anticoagulants: Secondary | ICD-10-CM | POA: Diagnosis not present

## 2015-09-27 ENCOUNTER — Encounter: Payer: Self-pay | Admitting: Cardiology

## 2015-10-07 ENCOUNTER — Other Ambulatory Visit: Payer: Self-pay | Admitting: Cardiology

## 2015-10-11 DIAGNOSIS — D509 Iron deficiency anemia, unspecified: Secondary | ICD-10-CM | POA: Diagnosis not present

## 2015-10-11 DIAGNOSIS — K5520 Angiodysplasia of colon without hemorrhage: Secondary | ICD-10-CM | POA: Diagnosis not present

## 2015-10-11 DIAGNOSIS — R195 Other fecal abnormalities: Secondary | ICD-10-CM | POA: Diagnosis not present

## 2015-11-01 DIAGNOSIS — Z7901 Long term (current) use of anticoagulants: Secondary | ICD-10-CM | POA: Diagnosis not present

## 2015-11-22 DIAGNOSIS — R972 Elevated prostate specific antigen [PSA]: Secondary | ICD-10-CM | POA: Diagnosis not present

## 2015-11-22 DIAGNOSIS — N201 Calculus of ureter: Secondary | ICD-10-CM | POA: Diagnosis not present

## 2015-11-22 DIAGNOSIS — C678 Malignant neoplasm of overlapping sites of bladder: Secondary | ICD-10-CM | POA: Diagnosis not present

## 2015-11-22 DIAGNOSIS — R319 Hematuria, unspecified: Secondary | ICD-10-CM | POA: Diagnosis not present

## 2015-11-22 DIAGNOSIS — N200 Calculus of kidney: Secondary | ICD-10-CM | POA: Diagnosis not present

## 2015-11-23 DIAGNOSIS — R195 Other fecal abnormalities: Secondary | ICD-10-CM | POA: Diagnosis not present

## 2015-11-23 DIAGNOSIS — D509 Iron deficiency anemia, unspecified: Secondary | ICD-10-CM | POA: Diagnosis not present

## 2015-12-14 DIAGNOSIS — Z7901 Long term (current) use of anticoagulants: Secondary | ICD-10-CM | POA: Diagnosis not present

## 2015-12-26 DIAGNOSIS — H401123 Primary open-angle glaucoma, left eye, severe stage: Secondary | ICD-10-CM | POA: Diagnosis not present

## 2015-12-26 DIAGNOSIS — H401112 Primary open-angle glaucoma, right eye, moderate stage: Secondary | ICD-10-CM | POA: Diagnosis not present

## 2016-01-25 DIAGNOSIS — Z7901 Long term (current) use of anticoagulants: Secondary | ICD-10-CM | POA: Diagnosis not present

## 2016-01-29 DIAGNOSIS — M7542 Impingement syndrome of left shoulder: Secondary | ICD-10-CM | POA: Diagnosis not present

## 2016-01-29 DIAGNOSIS — M25512 Pain in left shoulder: Secondary | ICD-10-CM | POA: Diagnosis not present

## 2016-03-11 DIAGNOSIS — Z7901 Long term (current) use of anticoagulants: Secondary | ICD-10-CM | POA: Diagnosis not present

## 2016-04-28 DIAGNOSIS — H401111 Primary open-angle glaucoma, right eye, mild stage: Secondary | ICD-10-CM | POA: Diagnosis not present

## 2016-04-28 DIAGNOSIS — H401122 Primary open-angle glaucoma, left eye, moderate stage: Secondary | ICD-10-CM | POA: Diagnosis not present

## 2016-05-20 DIAGNOSIS — D500 Iron deficiency anemia secondary to blood loss (chronic): Secondary | ICD-10-CM | POA: Diagnosis not present

## 2016-05-22 DIAGNOSIS — H401112 Primary open-angle glaucoma, right eye, moderate stage: Secondary | ICD-10-CM | POA: Diagnosis not present

## 2016-05-22 DIAGNOSIS — H02403 Unspecified ptosis of bilateral eyelids: Secondary | ICD-10-CM | POA: Diagnosis not present

## 2016-05-22 DIAGNOSIS — H401123 Primary open-angle glaucoma, left eye, severe stage: Secondary | ICD-10-CM | POA: Diagnosis not present

## 2016-05-22 DIAGNOSIS — H5213 Myopia, bilateral: Secondary | ICD-10-CM | POA: Diagnosis not present

## 2016-05-22 DIAGNOSIS — H2513 Age-related nuclear cataract, bilateral: Secondary | ICD-10-CM | POA: Diagnosis not present

## 2016-06-18 DIAGNOSIS — Z23 Encounter for immunization: Secondary | ICD-10-CM | POA: Diagnosis not present

## 2016-06-18 DIAGNOSIS — Z7901 Long term (current) use of anticoagulants: Secondary | ICD-10-CM | POA: Diagnosis not present

## 2016-06-24 DIAGNOSIS — Z7901 Long term (current) use of anticoagulants: Secondary | ICD-10-CM | POA: Diagnosis not present

## 2016-06-26 ENCOUNTER — Other Ambulatory Visit: Payer: Self-pay

## 2016-07-01 ENCOUNTER — Other Ambulatory Visit: Payer: Self-pay | Admitting: Cardiology

## 2016-07-15 DIAGNOSIS — Z7901 Long term (current) use of anticoagulants: Secondary | ICD-10-CM | POA: Diagnosis not present

## 2016-08-01 ENCOUNTER — Other Ambulatory Visit: Payer: Self-pay | Admitting: Cardiology

## 2016-08-18 DIAGNOSIS — Z7901 Long term (current) use of anticoagulants: Secondary | ICD-10-CM | POA: Diagnosis not present

## 2016-08-23 ENCOUNTER — Other Ambulatory Visit: Payer: Self-pay | Admitting: Cardiology

## 2016-08-25 ENCOUNTER — Other Ambulatory Visit: Payer: Self-pay | Admitting: *Deleted

## 2016-08-25 DIAGNOSIS — Z7901 Long term (current) use of anticoagulants: Secondary | ICD-10-CM | POA: Diagnosis not present

## 2016-08-25 MED ORDER — LISINOPRIL 10 MG PO TABS: ORAL_TABLET | ORAL | 0 refills | Status: DC

## 2016-09-10 ENCOUNTER — Encounter: Payer: Self-pay | Admitting: *Deleted

## 2016-09-15 DIAGNOSIS — Z7901 Long term (current) use of anticoagulants: Secondary | ICD-10-CM | POA: Diagnosis not present

## 2016-09-18 ENCOUNTER — Encounter: Payer: Self-pay | Admitting: Cardiology

## 2016-09-18 ENCOUNTER — Ambulatory Visit (INDEPENDENT_AMBULATORY_CARE_PROVIDER_SITE_OTHER): Payer: Medicare Other | Admitting: Cardiology

## 2016-09-18 VITALS — BP 134/72 | HR 63 | Ht 68.0 in | Wt 156.1 lb

## 2016-09-18 DIAGNOSIS — D151 Benign neoplasm of heart: Secondary | ICD-10-CM | POA: Diagnosis not present

## 2016-09-18 DIAGNOSIS — I059 Rheumatic mitral valve disease, unspecified: Secondary | ICD-10-CM

## 2016-09-18 DIAGNOSIS — I359 Nonrheumatic aortic valve disorder, unspecified: Secondary | ICD-10-CM | POA: Diagnosis not present

## 2016-09-18 DIAGNOSIS — I444 Left anterior fascicular block: Secondary | ICD-10-CM

## 2016-09-18 MED ORDER — LISINOPRIL 10 MG PO TABS: ORAL_TABLET | ORAL | 3 refills | Status: DC

## 2016-09-18 NOTE — Progress Notes (Signed)
[facility]. [address] [place], [place]  [address] Phone: [phone] Fax:  [phone]  Date:  09/18/2016   ID:  Coyt, Arnn 1946/10/14, MRN [id number]  PCP:  Gerrit Heck, MD   History of Present Illness: CIARAN SCHWARZKOPF is a 70 y.o. male with moderate aortic insufficiency seen on transesophageal echocardiogram 01/12/13 and Transthoracic ECHO 02/13/14 - here for followup. 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  He had a stroke in the 1990s, early 1990s, Dr. Erling Cruz took care of him, vertebral artery location. He was on chronic Coumadin since then. He remembers Dr. Erling Cruz telling him that he would consider stopping the Coumadin if he stopped smoking. He has not shown any signs of atrial fibrillation since I've known him. He does not wish to come off the Coumadin. He has not had any bleeding episodes.  Mild shortness of breath with exertion. This is not new for him. He still continues to smoke.  No chest pain.  Saw Neuro - when quit smoking would take off coumadin they stated.      Wt Readings from Last 3 Encounters:  09/18/16 156 lb 1.9 oz (70.8 kg)  06/12/15 158 lb 8 oz (71.9 kg)  04/06/14 162 lb (73.5 kg)     Past Medical History:  Diagnosis Date  . Aortic insufficiency   . Aortic valve disorder    mild to moderate- ECHO 02/23/09. Normal ejection fraction  . Back pain   . Cancer [facility])    Prostate cancer, neoplasm  . Chest pain     No CAD after Nuclear stress test-02/23/09-anteroseptal ischemia, abnormal, 7 minutes and 45 seconds ([place])  . Chronic kidney disease    Kidnye stone s/p Lithotripsy  . CVA (cerebral vascular accident) ([place])   . Heart murmur    Rheumatic fever  . Hypercholesteremia   . Hyperlipidemia   . 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 Polyp of colon   . Rheumatic fever   . Stroke ([place]) 1991     in 1991 (? 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 Erling Cruz)    Past Surgical History:  Procedure Laterality Date  . colonsocopy    . ESOPHAGOGASTRODUODENOSCOPY    . TEE WITHOUT CARDIOVERSION N/A 01/12/2013   Procedure: TRANSESOPHAGEAL ECHOCARDIOGRAM (TEE);  Surgeon: Candee Furbish, MD;  Location: [facility] ENDOSCOPY;  Service: Cardiovascular;  Laterality: N/A;  h&p in file-[facility]    Current Outpatient Prescriptions  Medication Sig Dispense Refill  . ferrous sulfate 325 (65 FE) MG tablet Take 325 mg by mouth daily with breakfast.    . lisinopril (PRINIVIL,ZESTRIL) 10 MG tablet TAKE 1 TABLET BY MOUTH EVERY DAY 90 tablet 3  . LUMIGAN 0.01 % SOLN     . timolol (TIMOPTIC) 0.5 % ophthalmic solution Place 1 drop into both eyes 2 (two) times daily.     [place] Kitchen warfarin (COUMADIN) 5 MG tablet     . warfarin (COUMADIN) 7.5 MG tablet Take 7.5 mg by mouth daily.     No current facility-administered medications for this visit.     Allergies:   No Known Allergies  Social History:  The patient  reports that he has been smoking Cigarettes.  He has been smoking about 1.00 pack per day. He has never used smokeless tobacco. He reports that he drinks about 0.6 oz  of alcohol per week . He reports that he does not use drugs.   ROS:  Please see the history of present illness.   No syncope, no bleeding, no orthopnea, no PND mild peripheral neuropathy, fifth digit, feet.  All other systems reviewed and negative.   PHYSICAL EXAM: VS:  BP 134/72   Pulse 63   Ht 5\' 8"  (1.727 m)   Wt 156 lb 1.9 oz (70.8 kg)   BMI 23.74 kg/m  Well nourished, well developed, in no acute distress Thin HEENT: normal  Neck: no JVD  Cardiac:  normal S1, S2; RRR; soft diastolic murmur (very challenging to hear) Upper sternal border Lungs:  clear to auscultation bilaterally, no wheezing, rhonchi or rales  Abd: soft, nontender, no hepatomegaly  Ext: no edema  Skin: warm and dry  Neuro: no focal abnormalities noted     EKG: Today 09/18/16 -  NSR with first degree AV block 230 ms LAFB personally viewed 06/12/15-sinus rhythm, 84, left axis deviation, nonspecific ST-T wave changes personally viewed-prior 02/01/14-sinus rhythm 69, borderline first degree AV block, PR interval 218 ms, left axis deviation. LVH.  ECHO: 02/13/14- Normal EF, moderate aortic regurgitation-stable from prior echocardiogram.  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 No evidence of recent stroke. Small 0.67cm. No recent CVA-like symptoms. 2. Tobacco use-counseling encouraged cessation. 3. Aortic insufficiency-moderate on TEE. 3/14 as well as echo 4/15.  Repeat ECHO. 4. History of stroke-he has been on Coumadin for several years. He stated that Dr. love was going to continue with the Coumadin as long as he continued to smoke. 5. Fatigue-I stopped his metoprolol and this may have helped a little bit but he still has some fatigue. Continue to encourage exercise. If palpitations return, consider diltiazem. 6. Hypertension- lisinopril 10 mg. lab work excellent. No changes made. Blood pressure under much better control. He does have a cough which he thinks is secondary to smoking. We will not make any changes at this time. See him back in 12 months.  Signed, Candee Furbish, MD [facility]  09/18/2016 10:10 AM

## 2016-09-18 NOTE — Patient Instructions (Signed)

## 2016-10-16 DIAGNOSIS — Z7901 Long term (current) use of anticoagulants: Secondary | ICD-10-CM | POA: Diagnosis not present

## 2016-10-22 DIAGNOSIS — H25813 Combined forms of age-related cataract, bilateral: Secondary | ICD-10-CM | POA: Diagnosis not present

## 2016-10-22 DIAGNOSIS — H401111 Primary open-angle glaucoma, right eye, mild stage: Secondary | ICD-10-CM | POA: Diagnosis not present

## 2016-10-22 DIAGNOSIS — H401122 Primary open-angle glaucoma, left eye, moderate stage: Secondary | ICD-10-CM | POA: Diagnosis not present

## 2016-10-22 DIAGNOSIS — H532 Diplopia: Secondary | ICD-10-CM | POA: Diagnosis not present

## 2016-11-17 DIAGNOSIS — Z7901 Long term (current) use of anticoagulants: Secondary | ICD-10-CM | POA: Diagnosis not present

## 2016-11-18 DIAGNOSIS — Z8551 Personal history of malignant neoplasm of bladder: Secondary | ICD-10-CM | POA: Diagnosis not present

## 2016-11-18 DIAGNOSIS — R972 Elevated prostate specific antigen [PSA]: Secondary | ICD-10-CM | POA: Diagnosis not present

## 2016-11-18 DIAGNOSIS — N200 Calculus of kidney: Secondary | ICD-10-CM | POA: Diagnosis not present

## 2016-11-18 DIAGNOSIS — N281 Cyst of kidney, acquired: Secondary | ICD-10-CM | POA: Diagnosis not present

## 2016-11-18 DIAGNOSIS — Z87442 Personal history of urinary calculi: Secondary | ICD-10-CM | POA: Diagnosis not present

## 2016-12-05 DIAGNOSIS — Z7901 Long term (current) use of anticoagulants: Secondary | ICD-10-CM | POA: Diagnosis not present

## 2016-12-05 DIAGNOSIS — D500 Iron deficiency anemia secondary to blood loss (chronic): Secondary | ICD-10-CM | POA: Diagnosis not present

## 2016-12-05 DIAGNOSIS — K639 Disease of intestine, unspecified: Secondary | ICD-10-CM | POA: Diagnosis not present

## 2016-12-19 ENCOUNTER — Other Ambulatory Visit: Payer: Self-pay | Admitting: Cardiology

## 2017-01-02 DIAGNOSIS — Z7901 Long term (current) use of anticoagulants: Secondary | ICD-10-CM | POA: Diagnosis not present

## 2017-01-08 DIAGNOSIS — Z7901 Long term (current) use of anticoagulants: Secondary | ICD-10-CM | POA: Diagnosis not present

## 2017-02-12 DIAGNOSIS — Z7901 Long term (current) use of anticoagulants: Secondary | ICD-10-CM | POA: Diagnosis not present

## 2017-03-25 DIAGNOSIS — Z7901 Long term (current) use of anticoagulants: Secondary | ICD-10-CM | POA: Diagnosis not present

## 2017-03-31 DIAGNOSIS — Z7901 Long term (current) use of anticoagulants: Secondary | ICD-10-CM | POA: Diagnosis not present

## 2017-03-31 DIAGNOSIS — E7800 Pure hypercholesterolemia, unspecified: Secondary | ICD-10-CM | POA: Diagnosis not present

## 2017-03-31 DIAGNOSIS — D509 Iron deficiency anemia, unspecified: Secondary | ICD-10-CM | POA: Diagnosis not present

## 2017-04-21 DIAGNOSIS — H401111 Primary open-angle glaucoma, right eye, mild stage: Secondary | ICD-10-CM | POA: Diagnosis not present

## 2017-04-21 DIAGNOSIS — H401122 Primary open-angle glaucoma, left eye, moderate stage: Secondary | ICD-10-CM | POA: Diagnosis not present

## 2017-05-29 DIAGNOSIS — Z7901 Long term (current) use of anticoagulants: Secondary | ICD-10-CM | POA: Diagnosis not present

## 2017-05-29 DIAGNOSIS — I359 Nonrheumatic aortic valve disorder, unspecified: Secondary | ICD-10-CM | POA: Diagnosis not present

## 2017-06-11 DIAGNOSIS — Z7901 Long term (current) use of anticoagulants: Secondary | ICD-10-CM | POA: Diagnosis not present

## 2017-06-11 DIAGNOSIS — D500 Iron deficiency anemia secondary to blood loss (chronic): Secondary | ICD-10-CM | POA: Diagnosis not present

## 2017-06-26 DIAGNOSIS — Z23 Encounter for immunization: Secondary | ICD-10-CM | POA: Diagnosis not present

## 2017-06-26 DIAGNOSIS — Z7901 Long term (current) use of anticoagulants: Secondary | ICD-10-CM | POA: Diagnosis not present

## 2017-09-11 ENCOUNTER — Other Ambulatory Visit: Payer: Self-pay | Admitting: Cardiology

## 2017-09-16 DIAGNOSIS — Z7901 Long term (current) use of anticoagulants: Secondary | ICD-10-CM | POA: Diagnosis not present

## 2017-09-28 ENCOUNTER — Telehealth: Payer: Self-pay | Admitting: Cardiology

## 2017-09-28 NOTE — Telephone Encounter (Signed)
When I called Mr. Tetterton to notify him of our office closure on Tuesday,09/29/17, due to inclement weather, he stated he was looking forward to his appointment because he wanted to talk about intermittent palpitations that he's been having while at rest. He stated that this has been happening for 4-5 months.   He stated he has no BP concerns and no other symptoms. I let him know that a nurse would be able to call him regarding this and we will work to reschedule his appointment that was scheduled for tomorrow.

## 2017-09-29 ENCOUNTER — Ambulatory Visit: Payer: Medicare Other | Admitting: Cardiology

## 2017-09-30 ENCOUNTER — Encounter: Payer: Self-pay | Admitting: Cardiology

## 2017-09-30 ENCOUNTER — Ambulatory Visit (INDEPENDENT_AMBULATORY_CARE_PROVIDER_SITE_OTHER): Payer: Medicare Other | Admitting: Cardiology

## 2017-09-30 VITALS — BP 120/60 | HR 80 | Ht 68.0 in | Wt 154.8 lb

## 2017-09-30 DIAGNOSIS — I10 Essential (primary) hypertension: Secondary | ICD-10-CM

## 2017-09-30 DIAGNOSIS — R002 Palpitations: Secondary | ICD-10-CM

## 2017-09-30 DIAGNOSIS — D151 Benign neoplasm of heart: Secondary | ICD-10-CM | POA: Diagnosis not present

## 2017-09-30 DIAGNOSIS — I359 Nonrheumatic aortic valve disorder, unspecified: Secondary | ICD-10-CM

## 2017-09-30 NOTE — Progress Notes (Signed)
[facility]. [address], [address] [place], [place]  [address] Phone: [phone] Fax:  [phone]  Date:  09/30/2017   ID:  Ricky Wagner, Ricky Wagner 20-Jun-1946, MRN [id number]  PCP:  Leighton Ruff, MD   History of Present Illness: Ricky Wagner is a 71 y.o. male with moderate aortic insufficiency seen on transesophageal echocardiogram 01/12/13 and Transthoracic ECHO 02/13/14 - here for followup. 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  He had a stroke in the 1990s, early 1990s, Dr. Erling Cruz took care of him, vertebral artery location. He was on chronic Coumadin since then. He remembers Dr. Erling Cruz telling him that he would consider stopping the Coumadin if he stopped smoking. He has not shown any signs of atrial fibrillation since I've known him. He does not wish to come off the Coumadin. He has not had any bleeding episodes.  Mild shortness of breath with exertion. This is not new for him. He still continues to smoke.  No chest pain.  Saw Neuro - when quit smoking would take off coumadin they stated.   09/30/17-he comes in today with complaints of palpitations.  These are new for him.  They have been going on over the last 4-6 months.  Usually they occur at rest in the evening hours.  He drinks quite a bit of coffee he states.  Still smoking.  No chest pain or shortness of breath.  No syncope.  They usually last about 15 minutes in duration.  Intermittent.  They feel like they take his breath occasionally.  Wt Readings from Last 3 Encounters:  09/30/17 154 lb 12.8 oz (70.2 kg)  09/18/16 156 lb 1.9 oz (70.8 kg)  06/12/15 158 lb 8 oz (71.9 kg)     Past Medical History:  Diagnosis Date  . Aortic insufficiency   . Aortic valve disorder    mild to moderate- ECHO 02/23/09. Normal ejection fraction  . Back pain   . Cancer [facility])    Prostate cancer, neoplasm  . Chest pain     No CAD after  Nuclear stress test-02/23/09-anteroseptal ischemia, abnormal, 7 minutes and 45 seconds ([place])  . Chronic kidney disease    Kidnye stone s/p Lithotripsy  . CVA (cerebral vascular accident) ([place])   . Heart murmur    Rheumatic fever  . Hypercholesteremia   . Hyperlipidemia   . 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 Polyp of colon   . Rheumatic fever   . Stroke ([place]) 1991   in 1991 (? 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 Erling Cruz)    Past Surgical History:  Procedure Laterality Date  . colonsocopy    . ESOPHAGOGASTRODUODENOSCOPY    . TEE WITHOUT CARDIOVERSION N/A 01/12/2013   Procedure: TRANSESOPHAGEAL ECHOCARDIOGRAM (TEE);  Surgeon: Candee Furbish, MD;  Location: [facility] ENDOSCOPY;  Service: Cardiovascular;  Laterality: N/A;  h&p in file-[facility]    Current Outpatient Medications  Medication Sig Dispense Refill  . ferrous sulfate 325 (65 FE) MG tablet Take 325 mg by mouth daily with breakfast.    . lisinopril (PRINIVIL,ZESTRIL) 10 MG tablet TAKE 1 TABLET BY MOUTH EVERY DAY. Please keep upcoming appt in December for future refills. Thanks 90 tablet 0  . LUMIGAN 0.01 % SOLN     . timolol (TIMOPTIC) 0.5 % ophthalmic solution Place 1 drop into both eyes 2 (  two) times daily.     [place] Kitchen warfarin (COUMADIN) 5 MG tablet     . warfarin (COUMADIN) 7.5 MG tablet Take 7.5 mg by mouth daily.     No current facility-administered medications for this visit.     Allergies:   No Known Allergies  Social History:  The patient  reports that he has been smoking cigarettes.  He has been smoking about 1.00 pack per day. he has never used smokeless tobacco. He reports that he drinks about 0.6 oz of alcohol per week. He reports that he does not use drugs.   ROS:  Please see the history of present illness.   No syncope, no bleeding, no orthopnea, no PND mild peripheral neuropathy, fifth digit, feet.  All other systems reviewed and negative.   PHYSICAL EXAM: VS:  BP 120/60   Pulse 80    Ht 5\' 8"  (1.727 m)   Wt 154 lb 12.8 oz (70.2 kg)   SpO2 97%   BMI 23.54 kg/m  GEN: Well nourished, well developed, in no acute distress  HEENT: normal  Neck: no JVD, carotid bruits, or masses Cardiac: RRR; soft diastolic murmur, no rubs, or gallops,no edema  Respiratory:  clear to auscultation bilaterally, normal work of breathing GI: soft, nontender, nondistended, + BS MS: no deformity or atrophy  Skin: warm and dry, no rash Neuro:  Alert and Oriented x 3, Strength and sensation are intact Psych: euthymic mood, full affect      EKG: 09/18/16 - NSR with first degree AV block 230 ms LAFB personally viewed 06/12/15-sinus rhythm, 84, left axis deviation, nonspecific ST-T wave changes personally viewed-prior 02/01/14-sinus rhythm 69, borderline first degree AV block, PR interval 218 ms, left axis deviation. LVH.  ECHO: 02/13/14- Normal EF, moderate aortic regurgitation-stable from prior echocardiogram.  ECHO 2016: Mild to moderate aortic regurgitation Normal EF  Stable  LABS: 03/29/14-Creat 1.1  ASSESSMENT AND PLAN:  1. Palpitations-intermittent at rest over the past 4-5 months.  If they get worse, we can always monitor him.  I will check an echocardiogram especially given his underlying aortic insufficiency to make sure there is no change in structure and function of his heart.  I have stopped Toprol in the past because of fatigue.  If the palpitations worsen, we could consider diltiazem. 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 No evidence of recent stroke. Small 0.67cm. No recent CVA-like symptoms.  Doing well 3. Tobacco use-counseling encouraged cessation.  Once again coached him on this 4. Aortic insufficiency-moderate on TEE. 3/14 as well as echo 4/15.  Repeat ECHO. 5. History of stroke-he has been on Coumadin for several years. He stated that Dr. Erling Cruz was going to continue with the Coumadin as long as he continued to smoke.  I talked to him about stopping  Coumadin but he wishes to continue.  Understands bleeding risks. 6. Fatigue-I stopped his metoprolol and this may have helped a little bit but he still has some fatigue. Continue to encourage exercise. 7. Hypertension- lisinopril 10 mg. Lab work excellent. No changes made. Blood pressure under much better control. See him back in 12 months.  Signed, Candee Furbish, MD [facility]  09/30/2017 3:55 PM

## 2017-09-30 NOTE — Telephone Encounter (Signed)
Spoke with pt - scheduled for today at 3 pm.

## 2017-09-30 NOTE — Patient Instructions (Signed)

## 2017-10-02 DIAGNOSIS — Z7901 Long term (current) use of anticoagulants: Secondary | ICD-10-CM | POA: Diagnosis not present

## 2017-10-02 DIAGNOSIS — E7800 Pure hypercholesterolemia, unspecified: Secondary | ICD-10-CM | POA: Diagnosis not present

## 2017-10-02 DIAGNOSIS — R21 Rash and other nonspecific skin eruption: Secondary | ICD-10-CM | POA: Diagnosis not present

## 2017-10-02 DIAGNOSIS — D509 Iron deficiency anemia, unspecified: Secondary | ICD-10-CM | POA: Diagnosis not present

## 2017-10-06 ENCOUNTER — Other Ambulatory Visit (HOSPITAL_COMMUNITY): Payer: Medicare Other

## 2017-10-08 ENCOUNTER — Other Ambulatory Visit: Payer: Self-pay

## 2017-10-08 ENCOUNTER — Ambulatory Visit (HOSPITAL_COMMUNITY): Payer: Medicare Other | Attending: Cardiovascular Disease

## 2017-10-08 DIAGNOSIS — I371 Nonrheumatic pulmonary valve insufficiency: Secondary | ICD-10-CM | POA: Insufficient documentation

## 2017-10-08 DIAGNOSIS — R002 Palpitations: Secondary | ICD-10-CM

## 2017-10-08 DIAGNOSIS — I083 Combined rheumatic disorders of mitral, aortic and tricuspid valves: Secondary | ICD-10-CM | POA: Diagnosis not present

## 2017-10-08 DIAGNOSIS — I359 Nonrheumatic aortic valve disorder, unspecified: Secondary | ICD-10-CM

## 2017-10-08 LAB — ECHOCARDIOGRAM COMPLETE
AOASC: 37 cm
E decel time: 313 msec
E/e' ratio: 8.64
FS: 31 % (ref 28–44)
IVS/LV PW RATIO, ED: 1.13
LA ID, A-P, ES: 35 mm
LA diam index: 1.91 cm/m2
LA vol A4C: 35 ml
LAVOL: 40 mL
LAVOLIN: 21.9 mL/m2
LDCA: 4.15 cm2
LEFT ATRIUM END SYS DIAM: 35 mm
LV E/e' medial: 8.64
LV E/e'average: 8.64
LV TDI E'LATERAL: 6.69
LV TDI E'MEDIAL: 5.92
LVELAT: 6.69 cm/s
LVOT VTI: 19.8 cm
LVOT peak grad rest: 3 mmHg
LVOT peak vel: 93.2 cm/s
LVOTD: 23 mm
LVOTSV: 82 mL
Lateral S' vel: 12.8 cm/s
MV Dec: 313
MV pk A vel: 86.9 m/s
MV pk E vel: 57.8 m/s
P 1/2 time: 435 ms
PW: 11.4 mm — AB (ref 0.6–1.1)

## 2017-10-28 DIAGNOSIS — H527 Unspecified disorder of refraction: Secondary | ICD-10-CM | POA: Diagnosis not present

## 2017-10-28 DIAGNOSIS — H401111 Primary open-angle glaucoma, right eye, mild stage: Secondary | ICD-10-CM | POA: Diagnosis not present

## 2017-10-28 DIAGNOSIS — H43813 Vitreous degeneration, bilateral: Secondary | ICD-10-CM | POA: Diagnosis not present

## 2017-10-28 DIAGNOSIS — H5051 Esophoria: Secondary | ICD-10-CM | POA: Diagnosis not present

## 2017-10-28 DIAGNOSIS — H25813 Combined forms of age-related cataract, bilateral: Secondary | ICD-10-CM | POA: Diagnosis not present

## 2017-10-28 DIAGNOSIS — H401122 Primary open-angle glaucoma, left eye, moderate stage: Secondary | ICD-10-CM | POA: Diagnosis not present

## 2017-10-28 DIAGNOSIS — H532 Diplopia: Secondary | ICD-10-CM | POA: Diagnosis not present

## 2017-10-28 DIAGNOSIS — H52223 Regular astigmatism, bilateral: Secondary | ICD-10-CM | POA: Diagnosis not present

## 2017-11-17 DIAGNOSIS — Z8551 Personal history of malignant neoplasm of bladder: Secondary | ICD-10-CM | POA: Diagnosis not present

## 2017-11-17 DIAGNOSIS — R972 Elevated prostate specific antigen [PSA]: Secondary | ICD-10-CM | POA: Diagnosis not present

## 2017-11-19 DIAGNOSIS — Z7901 Long term (current) use of anticoagulants: Secondary | ICD-10-CM | POA: Diagnosis not present

## 2017-11-23 DIAGNOSIS — H52223 Regular astigmatism, bilateral: Secondary | ICD-10-CM | POA: Diagnosis not present

## 2017-11-23 DIAGNOSIS — H25813 Combined forms of age-related cataract, bilateral: Secondary | ICD-10-CM | POA: Diagnosis not present

## 2017-11-23 DIAGNOSIS — H401111 Primary open-angle glaucoma, right eye, mild stage: Secondary | ICD-10-CM | POA: Diagnosis not present

## 2017-11-23 DIAGNOSIS — H401122 Primary open-angle glaucoma, left eye, moderate stage: Secondary | ICD-10-CM | POA: Diagnosis not present

## 2017-12-03 DIAGNOSIS — K639 Disease of intestine, unspecified: Secondary | ICD-10-CM | POA: Diagnosis not present

## 2017-12-03 DIAGNOSIS — R195 Other fecal abnormalities: Secondary | ICD-10-CM | POA: Diagnosis not present

## 2017-12-08 DIAGNOSIS — F17210 Nicotine dependence, cigarettes, uncomplicated: Secondary | ICD-10-CM | POA: Diagnosis not present

## 2017-12-08 DIAGNOSIS — H25811 Combined forms of age-related cataract, right eye: Secondary | ICD-10-CM | POA: Diagnosis not present

## 2017-12-08 DIAGNOSIS — H5051 Esophoria: Secondary | ICD-10-CM | POA: Diagnosis not present

## 2017-12-08 DIAGNOSIS — H527 Unspecified disorder of refraction: Secondary | ICD-10-CM | POA: Diagnosis not present

## 2017-12-08 DIAGNOSIS — H401122 Primary open-angle glaucoma, left eye, moderate stage: Secondary | ICD-10-CM | POA: Diagnosis not present

## 2017-12-08 DIAGNOSIS — H401111 Primary open-angle glaucoma, right eye, mild stage: Secondary | ICD-10-CM | POA: Diagnosis not present

## 2017-12-08 DIAGNOSIS — Z79899 Other long term (current) drug therapy: Secondary | ICD-10-CM | POA: Diagnosis not present

## 2017-12-08 DIAGNOSIS — H52223 Regular astigmatism, bilateral: Secondary | ICD-10-CM | POA: Diagnosis not present

## 2017-12-08 DIAGNOSIS — Z7901 Long term (current) use of anticoagulants: Secondary | ICD-10-CM | POA: Diagnosis not present

## 2017-12-08 DIAGNOSIS — H532 Diplopia: Secondary | ICD-10-CM | POA: Diagnosis not present

## 2017-12-09 DIAGNOSIS — H527 Unspecified disorder of refraction: Secondary | ICD-10-CM | POA: Diagnosis not present

## 2017-12-09 DIAGNOSIS — H52223 Regular astigmatism, bilateral: Secondary | ICD-10-CM | POA: Diagnosis not present

## 2017-12-09 DIAGNOSIS — Z961 Presence of intraocular lens: Secondary | ICD-10-CM | POA: Diagnosis not present

## 2017-12-09 DIAGNOSIS — H532 Diplopia: Secondary | ICD-10-CM | POA: Diagnosis not present

## 2017-12-09 DIAGNOSIS — H401122 Primary open-angle glaucoma, left eye, moderate stage: Secondary | ICD-10-CM | POA: Diagnosis not present

## 2017-12-09 DIAGNOSIS — H43813 Vitreous degeneration, bilateral: Secondary | ICD-10-CM | POA: Diagnosis not present

## 2017-12-09 DIAGNOSIS — H401111 Primary open-angle glaucoma, right eye, mild stage: Secondary | ICD-10-CM | POA: Diagnosis not present

## 2017-12-09 DIAGNOSIS — H5051 Esophoria: Secondary | ICD-10-CM | POA: Diagnosis not present

## 2017-12-09 DIAGNOSIS — H25812 Combined forms of age-related cataract, left eye: Secondary | ICD-10-CM | POA: Diagnosis not present

## 2017-12-14 ENCOUNTER — Other Ambulatory Visit: Payer: Self-pay | Admitting: Cardiology

## 2017-12-15 DIAGNOSIS — H401121 Primary open-angle glaucoma, left eye, mild stage: Secondary | ICD-10-CM | POA: Diagnosis not present

## 2017-12-15 DIAGNOSIS — H5703 Miosis: Secondary | ICD-10-CM | POA: Diagnosis not present

## 2017-12-15 DIAGNOSIS — H401122 Primary open-angle glaucoma, left eye, moderate stage: Secondary | ICD-10-CM | POA: Diagnosis not present

## 2017-12-15 DIAGNOSIS — H402222 Chronic angle-closure glaucoma, left eye, moderate stage: Secondary | ICD-10-CM | POA: Diagnosis not present

## 2017-12-15 DIAGNOSIS — Z8673 Personal history of transient ischemic attack (TIA), and cerebral infarction without residual deficits: Secondary | ICD-10-CM | POA: Diagnosis not present

## 2017-12-15 DIAGNOSIS — H25812 Combined forms of age-related cataract, left eye: Secondary | ICD-10-CM | POA: Diagnosis not present

## 2017-12-16 DIAGNOSIS — Z961 Presence of intraocular lens: Secondary | ICD-10-CM | POA: Diagnosis not present

## 2017-12-16 DIAGNOSIS — H401111 Primary open-angle glaucoma, right eye, mild stage: Secondary | ICD-10-CM | POA: Diagnosis not present

## 2017-12-16 DIAGNOSIS — H43813 Vitreous degeneration, bilateral: Secondary | ICD-10-CM | POA: Diagnosis not present

## 2017-12-16 DIAGNOSIS — H5051 Esophoria: Secondary | ICD-10-CM | POA: Diagnosis not present

## 2017-12-16 DIAGNOSIS — H527 Unspecified disorder of refraction: Secondary | ICD-10-CM | POA: Diagnosis not present

## 2017-12-16 DIAGNOSIS — H532 Diplopia: Secondary | ICD-10-CM | POA: Diagnosis not present

## 2017-12-16 DIAGNOSIS — H401122 Primary open-angle glaucoma, left eye, moderate stage: Secondary | ICD-10-CM | POA: Diagnosis not present

## 2017-12-16 DIAGNOSIS — H52223 Regular astigmatism, bilateral: Secondary | ICD-10-CM | POA: Diagnosis not present

## 2017-12-18 DIAGNOSIS — Z7901 Long term (current) use of anticoagulants: Secondary | ICD-10-CM | POA: Diagnosis not present

## 2018-01-19 DIAGNOSIS — Z720 Tobacco use: Secondary | ICD-10-CM | POA: Diagnosis not present

## 2018-01-19 DIAGNOSIS — Z7901 Long term (current) use of anticoagulants: Secondary | ICD-10-CM | POA: Diagnosis not present

## 2018-02-09 DIAGNOSIS — Z7901 Long term (current) use of anticoagulants: Secondary | ICD-10-CM | POA: Diagnosis not present

## 2018-03-11 DIAGNOSIS — Z7901 Long term (current) use of anticoagulants: Secondary | ICD-10-CM | POA: Diagnosis not present

## 2018-03-18 DIAGNOSIS — Z7901 Long term (current) use of anticoagulants: Secondary | ICD-10-CM | POA: Diagnosis not present

## 2018-03-24 DIAGNOSIS — N390 Urinary tract infection, site not specified: Secondary | ICD-10-CM | POA: Diagnosis not present

## 2018-03-24 DIAGNOSIS — Z7901 Long term (current) use of anticoagulants: Secondary | ICD-10-CM | POA: Diagnosis not present

## 2018-03-30 DIAGNOSIS — Z7901 Long term (current) use of anticoagulants: Secondary | ICD-10-CM | POA: Diagnosis not present

## 2018-04-08 DIAGNOSIS — Z7901 Long term (current) use of anticoagulants: Secondary | ICD-10-CM | POA: Diagnosis not present

## 2018-04-13 DIAGNOSIS — Z7901 Long term (current) use of anticoagulants: Secondary | ICD-10-CM | POA: Diagnosis not present

## 2018-05-10 DIAGNOSIS — Z7901 Long term (current) use of anticoagulants: Secondary | ICD-10-CM | POA: Diagnosis not present

## 2018-05-11 DIAGNOSIS — D500 Iron deficiency anemia secondary to blood loss (chronic): Secondary | ICD-10-CM | POA: Diagnosis not present

## 2018-05-11 DIAGNOSIS — R195 Other fecal abnormalities: Secondary | ICD-10-CM | POA: Diagnosis not present

## 2018-05-11 DIAGNOSIS — K639 Disease of intestine, unspecified: Secondary | ICD-10-CM | POA: Diagnosis not present

## 2018-05-31 DIAGNOSIS — Z7901 Long term (current) use of anticoagulants: Secondary | ICD-10-CM | POA: Diagnosis not present

## 2018-06-02 DIAGNOSIS — H401123 Primary open-angle glaucoma, left eye, severe stage: Secondary | ICD-10-CM | POA: Diagnosis not present

## 2018-06-02 DIAGNOSIS — H401112 Primary open-angle glaucoma, right eye, moderate stage: Secondary | ICD-10-CM | POA: Diagnosis not present

## 2018-06-28 DIAGNOSIS — Z7901 Long term (current) use of anticoagulants: Secondary | ICD-10-CM | POA: Diagnosis not present

## 2018-07-26 DIAGNOSIS — Z7901 Long term (current) use of anticoagulants: Secondary | ICD-10-CM | POA: Diagnosis not present

## 2018-07-26 DIAGNOSIS — Z23 Encounter for immunization: Secondary | ICD-10-CM | POA: Diagnosis not present

## 2018-08-23 DIAGNOSIS — Z7901 Long term (current) use of anticoagulants: Secondary | ICD-10-CM | POA: Diagnosis not present

## 2018-09-27 DIAGNOSIS — Z7901 Long term (current) use of anticoagulants: Secondary | ICD-10-CM | POA: Diagnosis not present

## 2018-10-04 DIAGNOSIS — H401112 Primary open-angle glaucoma, right eye, moderate stage: Secondary | ICD-10-CM | POA: Diagnosis not present

## 2018-10-04 DIAGNOSIS — H401123 Primary open-angle glaucoma, left eye, severe stage: Secondary | ICD-10-CM | POA: Diagnosis not present

## 2018-10-14 DIAGNOSIS — Z961 Presence of intraocular lens: Secondary | ICD-10-CM | POA: Diagnosis not present

## 2018-10-14 DIAGNOSIS — H401122 Primary open-angle glaucoma, left eye, moderate stage: Secondary | ICD-10-CM | POA: Diagnosis not present

## 2018-10-14 DIAGNOSIS — H26493 Other secondary cataract, bilateral: Secondary | ICD-10-CM | POA: Diagnosis not present

## 2018-10-14 DIAGNOSIS — H401111 Primary open-angle glaucoma, right eye, mild stage: Secondary | ICD-10-CM | POA: Diagnosis not present

## 2018-10-14 DIAGNOSIS — H527 Unspecified disorder of refraction: Secondary | ICD-10-CM | POA: Diagnosis not present

## 2018-10-28 DIAGNOSIS — H26492 Other secondary cataract, left eye: Secondary | ICD-10-CM | POA: Diagnosis not present

## 2018-10-28 DIAGNOSIS — H26491 Other secondary cataract, right eye: Secondary | ICD-10-CM | POA: Diagnosis not present

## 2018-10-28 DIAGNOSIS — H2640 Unspecified secondary cataract: Secondary | ICD-10-CM | POA: Diagnosis not present

## 2018-11-16 DIAGNOSIS — C678 Malignant neoplasm of overlapping sites of bladder: Secondary | ICD-10-CM | POA: Diagnosis not present

## 2018-11-16 DIAGNOSIS — Z87442 Personal history of urinary calculi: Secondary | ICD-10-CM | POA: Diagnosis not present

## 2018-11-16 DIAGNOSIS — Z8551 Personal history of malignant neoplasm of bladder: Secondary | ICD-10-CM | POA: Diagnosis not present

## 2018-11-16 DIAGNOSIS — N281 Cyst of kidney, acquired: Secondary | ICD-10-CM | POA: Diagnosis not present

## 2018-11-16 DIAGNOSIS — R972 Elevated prostate specific antigen [PSA]: Secondary | ICD-10-CM | POA: Diagnosis not present

## 2018-11-16 DIAGNOSIS — N200 Calculus of kidney: Secondary | ICD-10-CM | POA: Diagnosis not present

## 2018-11-23 DIAGNOSIS — R972 Elevated prostate specific antigen [PSA]: Secondary | ICD-10-CM | POA: Diagnosis not present

## 2018-12-03 DIAGNOSIS — Z7901 Long term (current) use of anticoagulants: Secondary | ICD-10-CM | POA: Diagnosis not present

## 2018-12-10 DIAGNOSIS — Z7901 Long term (current) use of anticoagulants: Secondary | ICD-10-CM | POA: Diagnosis not present

## 2018-12-13 ENCOUNTER — Other Ambulatory Visit: Payer: Self-pay | Admitting: Cardiology

## 2018-12-30 ENCOUNTER — Other Ambulatory Visit: Payer: Self-pay | Admitting: Cardiology

## 2019-01-07 DIAGNOSIS — Z7901 Long term (current) use of anticoagulants: Secondary | ICD-10-CM | POA: Diagnosis not present

## 2019-01-08 ENCOUNTER — Other Ambulatory Visit: Payer: Self-pay | Admitting: Cardiology

## 2019-02-04 DIAGNOSIS — Z7901 Long term (current) use of anticoagulants: Secondary | ICD-10-CM | POA: Diagnosis not present

## 2019-04-05 ENCOUNTER — Other Ambulatory Visit: Payer: Self-pay | Admitting: Cardiology

## 2019-05-02 ENCOUNTER — Other Ambulatory Visit: Payer: Self-pay | Admitting: Cardiology

## 2019-05-24 DIAGNOSIS — Z8551 Personal history of malignant neoplasm of bladder: Secondary | ICD-10-CM | POA: Diagnosis not present

## 2019-05-24 DIAGNOSIS — N281 Cyst of kidney, acquired: Secondary | ICD-10-CM | POA: Diagnosis not present

## 2019-05-24 DIAGNOSIS — Z87442 Personal history of urinary calculi: Secondary | ICD-10-CM | POA: Diagnosis not present

## 2019-05-24 DIAGNOSIS — N2889 Other specified disorders of kidney and ureter: Secondary | ICD-10-CM | POA: Diagnosis not present

## 2019-05-24 DIAGNOSIS — R972 Elevated prostate specific antigen [PSA]: Secondary | ICD-10-CM | POA: Diagnosis not present

## 2019-05-27 DIAGNOSIS — D500 Iron deficiency anemia secondary to blood loss (chronic): Secondary | ICD-10-CM | POA: Diagnosis not present

## 2019-05-27 DIAGNOSIS — Z7901 Long term (current) use of anticoagulants: Secondary | ICD-10-CM | POA: Diagnosis not present

## 2019-06-02 DIAGNOSIS — Z961 Presence of intraocular lens: Secondary | ICD-10-CM | POA: Diagnosis not present

## 2019-06-02 DIAGNOSIS — H401112 Primary open-angle glaucoma, right eye, moderate stage: Secondary | ICD-10-CM | POA: Diagnosis not present

## 2019-06-02 DIAGNOSIS — H401123 Primary open-angle glaucoma, left eye, severe stage: Secondary | ICD-10-CM | POA: Diagnosis not present

## 2019-06-03 DIAGNOSIS — N281 Cyst of kidney, acquired: Secondary | ICD-10-CM | POA: Diagnosis not present

## 2019-06-24 DIAGNOSIS — Z7901 Long term (current) use of anticoagulants: Secondary | ICD-10-CM | POA: Diagnosis not present

## 2019-06-30 DIAGNOSIS — Z23 Encounter for immunization: Secondary | ICD-10-CM | POA: Diagnosis not present

## 2019-07-06 DIAGNOSIS — D509 Iron deficiency anemia, unspecified: Secondary | ICD-10-CM | POA: Diagnosis not present

## 2019-07-06 DIAGNOSIS — D500 Iron deficiency anemia secondary to blood loss (chronic): Secondary | ICD-10-CM | POA: Diagnosis not present

## 2019-07-19 DIAGNOSIS — Z87442 Personal history of urinary calculi: Secondary | ICD-10-CM | POA: Diagnosis not present

## 2019-07-19 DIAGNOSIS — Z8551 Personal history of malignant neoplasm of bladder: Secondary | ICD-10-CM | POA: Diagnosis not present

## 2019-07-19 DIAGNOSIS — R319 Hematuria, unspecified: Secondary | ICD-10-CM | POA: Diagnosis not present

## 2019-07-19 DIAGNOSIS — C61 Malignant neoplasm of prostate: Secondary | ICD-10-CM | POA: Diagnosis not present

## 2019-07-19 DIAGNOSIS — Z7901 Long term (current) use of anticoagulants: Secondary | ICD-10-CM | POA: Diagnosis not present

## 2019-07-21 DIAGNOSIS — Z87442 Personal history of urinary calculi: Secondary | ICD-10-CM | POA: Diagnosis not present

## 2019-07-21 DIAGNOSIS — Z87448 Personal history of other diseases of urinary system: Secondary | ICD-10-CM | POA: Diagnosis not present

## 2019-07-21 DIAGNOSIS — Z8551 Personal history of malignant neoplasm of bladder: Secondary | ICD-10-CM | POA: Diagnosis not present

## 2019-07-24 ENCOUNTER — Other Ambulatory Visit: Payer: Self-pay | Admitting: Cardiology

## 2019-07-27 ENCOUNTER — Ambulatory Visit: Payer: Medicare Other | Admitting: Cardiology

## 2019-08-01 DIAGNOSIS — N200 Calculus of kidney: Secondary | ICD-10-CM | POA: Diagnosis not present

## 2019-08-01 DIAGNOSIS — Z8551 Personal history of malignant neoplasm of bladder: Secondary | ICD-10-CM | POA: Diagnosis not present

## 2019-08-16 ENCOUNTER — Other Ambulatory Visit: Payer: Self-pay | Admitting: Cardiology

## 2019-08-19 ENCOUNTER — Other Ambulatory Visit: Payer: Self-pay | Admitting: Cardiology

## 2019-08-22 ENCOUNTER — Ambulatory Visit (INDEPENDENT_AMBULATORY_CARE_PROVIDER_SITE_OTHER): Payer: Medicare Other | Admitting: Cardiology

## 2019-08-22 ENCOUNTER — Encounter: Payer: Self-pay | Admitting: Cardiology

## 2019-08-22 ENCOUNTER — Other Ambulatory Visit: Payer: Self-pay

## 2019-08-22 VITALS — BP 128/62 | HR 69 | Ht 68.0 in | Wt 154.0 lb

## 2019-08-22 DIAGNOSIS — D151 Benign neoplasm of heart: Secondary | ICD-10-CM | POA: Diagnosis not present

## 2019-08-22 DIAGNOSIS — I359 Nonrheumatic aortic valve disorder, unspecified: Secondary | ICD-10-CM | POA: Diagnosis not present

## 2019-08-22 DIAGNOSIS — I10 Essential (primary) hypertension: Secondary | ICD-10-CM | POA: Diagnosis not present

## 2019-08-22 DIAGNOSIS — R002 Palpitations: Secondary | ICD-10-CM

## 2019-08-22 NOTE — Patient Instructions (Signed)
Medication Instructions:  The current medical regimen is effective;  continue present plan and medications.  *If you need a refill on your cardiac medications before your next appointment, please call your pharmacy*  Follow-Up: At [facility], you and your health needs are our priority.  As part of our continuing mission to provide you with exceptional heart care, we have created designated Provider Care Teams.  These Care Teams include your primary Cardiologist (physician) and Advanced Practice Providers (APPs -  Physician Assistants and Nurse Practitioners) who all work together to provide you with the care you need, when you need it.  Your next appointment:   12 months  The format for your next appointment:   In Person  Provider:   You may see Mark Skains, MD or one of the following Advanced Practice Providers on your designated Care Team:    Lori Gerhardt, NP  Laura Ingold, NP  Jill McDaniel, NP   Thank you for choosing [facility]!!      

## 2019-08-22 NOTE — Progress Notes (Signed)
[place]. [address], [address] [place], [place]  [address] Phone: [phone] Fax:  [phone]  Date:  08/22/2019   ID:  Antwan, Tritten 1946/10/17, MRN [id number]  PCP:  Leighton Ruff, MD   History of Present Illness: MONTIQUE BETZEN is a 73 y.o. male with moderate aortic insufficiency seen on transesophageal echocardiogram 01/12/13 and Transthoracic ECHO 02/13/14 - here for followup. 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  He had a stroke in the 1990s, early 1990s, Dr. Erling Cruz took care of him, vertebral artery location. He was on chronic Coumadin since then. He remembers Dr. Erling Cruz telling him that he would consider stopping the Coumadin if he stopped smoking. He has not shown any signs of atrial fibrillation since I've known him. He does not wish to come off the Coumadin. He has not had any bleeding episodes.  Mild shortness of breath with exertion. This is not new for him. He still continues to smoke.  No chest pain.  Saw Neuro - when quit smoking would take off coumadin they stated.   09/30/17-he comes in today with complaints of palpitations.  These are new for him.  They have been going on over the last 4-6 months.  Usually they occur at rest in the evening hours.  He drinks quite a bit of coffee he states.  Still smoking.  No chest pain or shortness of breath.  No syncope.  They usually last about 15 minutes in duration.  Intermittent.  They feel like they take his breath occasionally.  08/22/2019-here for the follow-up of aortic regurgitation, mitral valve papillary fibroblastoma.  He is doing well.  No new strokelike symptoms.  Taking his Coumadin.  Last echocardiogram 2018 showed only mild aortic regurgitation with normal EF.  Continuing to do well, no further fatigue no complaints of palpitations.  Wt Readings from Last 3 Encounters:  08/22/19 154 lb (69.9 kg)  09/30/17  154 lb 12.8 oz (70.2 kg)  09/18/16 156 lb 1.9 oz (70.8 kg)     Past Medical History:  Diagnosis Date  . Aortic insufficiency   . Aortic valve disorder    mild to moderate- ECHO 02/23/09. Normal ejection fraction  . Back pain   . Cancer [facility])    Prostate cancer, neoplasm  . Chest pain     No CAD after Nuclear stress test-02/23/09-anteroseptal ischemia, abnormal, 7 minutes and 45 seconds ([facility])  . Chronic kidney disease    Kidnye stone s/p Lithotripsy  . CVA (cerebral vascular accident) ([place])   . Heart murmur    Rheumatic fever  . Hypercholesteremia   . Hyperlipidemia   . 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 Polyp of colon   . Rheumatic fever   . Stroke ([place]) 1991   in 1991 (? 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 Erling Cruz)    Past Surgical History:  Procedure Laterality Date  . colonsocopy    . ESOPHAGOGASTRODUODENOSCOPY    . TEE WITHOUT CARDIOVERSION N/A 01/12/2013   Procedure: TRANSESOPHAGEAL ECHOCARDIOGRAM (TEE);  Surgeon: Candee Furbish, MD;  Location: [facility] ENDOSCOPY;  Service: Cardiovascular;  Laterality: N/A;  h&p in file-[facility]    Current Outpatient Medications  Medication Sig Dispense Refill  . ferrous sulfate 325 (65 FE) MG tablet Take 325 mg by mouth daily with breakfast.    . lisinopril (  ZESTRIL) 10 MG tablet Take 1 tablet (10 mg total) by mouth daily. 90 tablet 0  . LUMIGAN 0.01 % SOLN     . timolol (TIMOPTIC) 0.5 % ophthalmic solution Place 1 drop into both eyes 2 (two) times daily.     Marland Kitchen warfarin (COUMADIN) 5 MG tablet Take 5 mg by mouth daily.      No current facility-administered medications for this visit.     Allergies:   No Known Allergies  Social History:  The patient  reports that he has been smoking cigarettes. He has been smoking about 1.00 pack per day. He has never used smokeless tobacco. He reports current alcohol use of about 1.0 standard drinks of alcohol per week. He reports that he does not use drugs.   ROS:   Please see the history of present illness.   No syncope, no bleeding, no orthopnea, no PND mild peripheral neuropathy, fifth digit, feet.  All other systems reviewed and negative.   PHYSICAL EXAM: VS:  BP 128/62   Pulse 69   Ht 5\' 8"  (1.727 m)   Wt 154 lb (69.9 kg)   SpO2 98%   BMI 23.42 kg/m  GEN: Well nourished, well developed, in no acute distress  HEENT: normal  Neck: no JVD, carotid bruits, or masses Cardiac: RRR; soft diastolic murmur, no rubs, or gallops,no edema  Respiratory:  clear to auscultation bilaterally, normal work of breathing GI: soft, nontender, nondistended, + BS MS: no deformity or atrophy  Skin: warm and dry, no rash Neuro:  Alert and Oriented x 3, Strength and sensation are intact Psych: euthymic mood, full affect      EKG: 08/22/2019-sinus rhythm incomplete right bundle branch block J-point elevation V2 left anterior fascicular block LVH.  09/18/16 - NSR with first degree AV block 230 ms LAFB personally viewed 06/12/15-sinus rhythm, 84, left axis deviation, nonspecific ST-T wave changes personally viewed-prior 02/01/14-sinus rhythm 69, borderline first degree AV block, PR interval 218 ms, left axis deviation. LVH.  ECHO: 02/13/14- Normal EF, moderate aortic regurgitation-stable from prior echocardiogram.  ECHO 2016: Mild to moderate aortic regurgitation Normal EF  Echocardiogram 2018: -Aortic regurgitation felt to be mild, mild MR as well, normal EF   ASSESSMENT AND PLAN:  Mitral valve fibroblastoma - continue to monitor clinically. No evidence of recent stroke. Small 0.67cm. No recent CVA-like symptoms.  Doing well.  Continue with Coumadin long-term for his prior stroke.  Tobacco use -counseling encouraged cessation.  Once again coached him on this, no changes.  Still smoking.  Aortic insufficiency -moderate on TEE. 3/14 as well as echo 4/15.  Most recent echo in 2018 however showed only mild regurgitation.  I think it is fine to skip echocardiogram this  year.  We will consider next year.  History of stroke -he has been on Coumadin for several years. He stated that Dr. Erling Cruz was going to continue with the Coumadin as long as he continued to smoke.  I talked to him about stopping Coumadin but he wishes to continue.  Understands bleeding risks.  No changes.  Hypertension - lisinopril 10 mg. Lab work excellent. No changes made. Blood pressure under much better control.   See him back in 12 months.  Signed, Candee Furbish, MD [facility]  08/22/2019 11:59 AM

## 2019-09-20 DIAGNOSIS — Z7901 Long term (current) use of anticoagulants: Secondary | ICD-10-CM | POA: Diagnosis not present

## 2019-10-27 DIAGNOSIS — Z7901 Long term (current) use of anticoagulants: Secondary | ICD-10-CM | POA: Diagnosis not present

## 2019-11-11 ENCOUNTER — Other Ambulatory Visit: Payer: Self-pay | Admitting: Cardiology

## 2019-12-11 ENCOUNTER — Ambulatory Visit: Payer: Medicare Other | Attending: Internal Medicine

## 2019-12-11 DIAGNOSIS — Z23 Encounter for immunization: Secondary | ICD-10-CM | POA: Insufficient documentation

## 2019-12-11 NOTE — Progress Notes (Signed)
   Covid-19 Vaccination Clinic  Name:  EXAVIOR TEEM    MRN: [id number] DOB: 07/23/1946  12/11/2019  Mr. Loris was observed post Covid-19 immunization for 15 minutes without incidence. He was provided with Vaccine Information Sheet and instruction to access the V-Safe system.   Mr. Schooling was instructed to call 911 with any severe reactions post vaccine: Marland Kitchen Difficulty breathing  . Swelling of your face and throat  . A fast heartbeat  . A bad rash all over your body  . Dizziness and weakness    Immunizations Administered    Name Date Dose VIS Date Route   Pfizer COVID-19 Vaccine 12/11/2019  9:20 AM 0.3 mL 09/30/2019 Intramuscular   Manufacturer: [place]   Lot: [id number]   [place]: [id number]

## 2020-01-04 ENCOUNTER — Ambulatory Visit: Payer: Medicare Other | Attending: Internal Medicine

## 2020-01-04 DIAGNOSIS — Z23 Encounter for immunization: Secondary | ICD-10-CM

## 2020-01-04 NOTE — Progress Notes (Signed)
   Covid-19 Vaccination Clinic  Name:  Ricky Wagner    MRN: [id number] DOB: 1946-04-18  01/04/2020  [name] was observed post Covid-19 immunization for 15 minutes without incident. He was provided with Vaccine Information Sheet and instruction to access the V-Safe system.   [name] was instructed to call 911 with any severe reactions post vaccine: Marland Kitchen Difficulty breathing  . Swelling of face and throat  . A fast heartbeat  . A bad rash all over body  . Dizziness and weakness   Immunizations Administered    Name Date Dose VIS Date Route   Pfizer COVID-19 Vaccine 01/04/2020  8:18 AM 0.3 mL 09/30/2019 Intramuscular   Manufacturer: [place]   Lot: [id number]   [place]: [id number]

## 2020-02-02 DIAGNOSIS — Z7901 Long term (current) use of anticoagulants: Secondary | ICD-10-CM | POA: Diagnosis not present

## 2020-02-03 DIAGNOSIS — H401123 Primary open-angle glaucoma, left eye, severe stage: Secondary | ICD-10-CM | POA: Diagnosis not present

## 2020-02-03 DIAGNOSIS — H401112 Primary open-angle glaucoma, right eye, moderate stage: Secondary | ICD-10-CM | POA: Diagnosis not present

## 2020-04-25 DIAGNOSIS — Z7901 Long term (current) use of anticoagulants: Secondary | ICD-10-CM | POA: Diagnosis not present

## 2020-05-28 DIAGNOSIS — Z7901 Long term (current) use of anticoagulants: Secondary | ICD-10-CM | POA: Diagnosis not present

## 2020-06-28 DIAGNOSIS — Z7901 Long term (current) use of anticoagulants: Secondary | ICD-10-CM | POA: Diagnosis not present

## 2020-07-03 DIAGNOSIS — Z7901 Long term (current) use of anticoagulants: Secondary | ICD-10-CM | POA: Diagnosis not present

## 2020-07-12 DIAGNOSIS — Z23 Encounter for immunization: Secondary | ICD-10-CM | POA: Diagnosis not present

## 2020-07-18 DIAGNOSIS — Z23 Encounter for immunization: Secondary | ICD-10-CM | POA: Diagnosis not present

## 2020-08-13 DIAGNOSIS — H401112 Primary open-angle glaucoma, right eye, moderate stage: Secondary | ICD-10-CM | POA: Diagnosis not present

## 2020-08-13 DIAGNOSIS — H401123 Primary open-angle glaucoma, left eye, severe stage: Secondary | ICD-10-CM | POA: Diagnosis not present

## 2020-09-04 DIAGNOSIS — F17200 Nicotine dependence, unspecified, uncomplicated: Secondary | ICD-10-CM | POA: Diagnosis not present

## 2020-09-04 DIAGNOSIS — M545 Low back pain, unspecified: Secondary | ICD-10-CM | POA: Diagnosis not present

## 2020-09-04 DIAGNOSIS — Z7901 Long term (current) use of anticoagulants: Secondary | ICD-10-CM | POA: Diagnosis not present

## 2020-09-04 DIAGNOSIS — E7800 Pure hypercholesterolemia, unspecified: Secondary | ICD-10-CM | POA: Diagnosis not present

## 2020-09-12 ENCOUNTER — Other Ambulatory Visit: Payer: Self-pay

## 2020-09-12 ENCOUNTER — Ambulatory Visit (INDEPENDENT_AMBULATORY_CARE_PROVIDER_SITE_OTHER): Payer: Medicare Other | Admitting: Cardiology

## 2020-09-12 ENCOUNTER — Encounter: Payer: Self-pay | Admitting: Cardiology

## 2020-09-12 VITALS — BP 140/70 | HR 71 | Ht 68.0 in | Wt 146.8 lb

## 2020-09-12 DIAGNOSIS — I359 Nonrheumatic aortic valve disorder, unspecified: Secondary | ICD-10-CM

## 2020-09-12 DIAGNOSIS — D151 Benign neoplasm of heart: Secondary | ICD-10-CM | POA: Diagnosis not present

## 2020-09-12 DIAGNOSIS — I059 Rheumatic mitral valve disease, unspecified: Secondary | ICD-10-CM

## 2020-09-12 NOTE — Progress Notes (Signed)
Cardiology Office Note:    Date:  09/12/2020   ID:  Ricky Wagner, DOB 1945/12/02, MRN [id number]  PCP:  Leighton Ruff, MD  [facility] Cardiologist:  Candee Furbish, MD  [facility] Electrophysiologist:  None   Referring MD: Leighton Ruff, MD     History of Present Illness:    Ricky Wagner is a 74 y.o. male here for the follow-up of moderate aortic regurgitation.  In fact, 2018 echocardiogram showed mild aortic regurgitation with normal EF.  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  Was on chronic Coumadin.  He did not show any signs of atrial fibrillation.  He was to continue with the Coumadin.  Doing well no bleeding no chest pain no strokelike symptoms.  Past Medical History:  Diagnosis Date  . Aortic insufficiency   . Aortic valve disorder    mild to moderate- ECHO 02/23/09. Normal ejection fraction  . Back pain   . Cancer [facility])    Prostate cancer, neoplasm  . Chest pain     No CAD after Nuclear stress test-02/23/09-anteroseptal ischemia, abnormal, 7 minutes and 45 seconds ([facility])  . Chronic kidney disease    Kidnye stone s/p Lithotripsy  . CVA (cerebral vascular accident) ([place])   . Heart murmur    Rheumatic fever  . Hypercholesteremia   . Hyperlipidemia   . 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 Polyp of colon   . Rheumatic fever   . Stroke ([place]) 1991   in 1991 (? 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 Erling Cruz)    Past Surgical History:  Procedure Laterality Date  . colonsocopy    . ESOPHAGOGASTRODUODENOSCOPY    . TEE WITHOUT CARDIOVERSION N/A 01/12/2013   Procedure: TRANSESOPHAGEAL ECHOCARDIOGRAM (TEE);  Surgeon: Candee Furbish, MD;  Location: [facility] ENDOSCOPY;  Service: Cardiovascular;  Laterality: N/A;  h&p in file-[facility]    Current Medications: Current Meds    Medication Sig  . ferrous sulfate 325 (65 FE) MG tablet Take 325 mg by mouth daily with breakfast.  . lisinopril (ZESTRIL) 10 MG tablet TAKE 1 TABLET BY MOUTH EVERY DAY  . LUMIGAN 0.01 % SOLN   . timolol (TIMOPTIC) 0.5 % ophthalmic solution Place 1 drop into both eyes 2 (two) times daily.   Marland Kitchen warfarin (COUMADIN) 5 MG tablet Take 5 mg by mouth daily.      Allergies:   Patient has no known allergies.   Social History   Socioeconomic History  . Marital status: Married    Spouse name: Not on file  . Number of children: Not on file  . Years of education: Not on file  . Highest education level: Not on file  Occupational History  . Not on file  Tobacco Use  . Smoking status: Current Every Day Smoker    Packs/day: 1.00    Types: Cigarettes  . Smokeless tobacco: Never Used  Substance and Sexual Activity  . Alcohol use: Yes    Alcohol/week: 1.0 standard drink    Types: 1 Cans of beer per week    Comment: occasional  . Drug use: No  . Sexual activity: Not on file  Other Topics Concern  . Not on file  Social History Narrative  . Not on file   Social Determinants of Health   Financial  Resource Strain:   . Difficulty of Paying Living Expenses: Not on file  Food Insecurity:   . Worried About Charity fundraiser in the Last Year: Not on file  . Ran Out of Food in the Last Year: Not on file  Transportation Needs:   . Lack of Transportation (Medical): Not on file  . Lack of Transportation (Non-Medical): Not on file  Physical Activity:   . Days of Exercise per Week: Not on file  . Minutes of Exercise per Session: Not on file  Stress:   . Feeling of Stress : Not on file  Social Connections:   . Frequency of Communication with Friends and Family: Not on file  . Frequency of Social Gatherings with Friends and Family: Not on file  . Attends Religious Services: Not on file  . Active Member of Clubs or Organizations: Not on file  . Attends Archivist Meetings: Not on file   . Marital Status: Not on file     Family History: The patient's family history includes Cancer in his father and mother; Diabetes in his mother; Heart disease in his brother; Hypertension in his sister and sister.  ROS:   Please see the history of present illness.     All other systems reviewed and are negative.  EKGs/Labs/Other Studies Reviewed:    The following studies were reviewed today:  ECHO 2018: - Left ventricle: The cavity size was normal. Systolic function was  normal. The estimated ejection fraction was in the range of 55%  to 60%. Wall motion was normal; there were no regional wall  motion abnormalities.  - Aortic valve: Mobile calcified sturcture at base of anterior  mitral leaflt in LVOTmay be degenerative material from aortic  valve. There was mild regurgitation.  - Mitral valve: There was mild regurgitation.  - Atrial septum: No defect or patent foramen ovale was identified.   EKG:  EKG is  ordered today.  The ekg ordered today demonstrates sinus rhythm first-degree AV block PR interval 218 ms left anterior fascicular block  Recent Labs: No results found for requested labs within last 8760 hours.  Recent Lipid Panel No results found for: CHOL, TRIG, HDL, CHOLHDL, VLDL, LDLCALC, LDLDIRECT   Risk Assessment/Calculations:       Physical Exam:    VS:  BP 140/70   Pulse 71   Ht 5\' 8"  (1.727 m)   Wt 146 lb 12.8 oz (66.6 kg)   SpO2 98%   BMI 22.32 kg/m     Wt Readings from Last 3 Encounters:  09/12/20 146 lb 12.8 oz (66.6 kg)  08/22/19 154 lb (69.9 kg)  09/30/17 154 lb 12.8 oz (70.2 kg)     GEN:  Well nourished, well developed in no acute distress HEENT: Normal NECK: No JVD; No carotid bruits LYMPHATICS: No lymphadenopathy CARDIAC: RRR, 2/6 S murmur,no  rubs, gallops RESPIRATORY:  Clear to auscultation without rales, wheezing or rhonchi  ABDOMEN: Soft, non-tender, non-distended MUSCULOSKELETAL:  No edema; No deformity  SKIN: Warm and  dry NEUROLOGIC:  Alert and oriented x 3 PSYCHIATRIC:  Normal affect   ASSESSMENT:    1. Aortic valve disorder   2. Papillary fibroelastoma of heart   3. Mitral valve disorder    PLAN:    In order of problems listed above:  Mitral valve papillary fibroblastoma -Small 0.67 cm.  No strokelike symptoms.  On Coumadin long-term.  Monitoring with echo.  Aortic regurgitation -Previously moderate on TEE in 2014.  Most recent echo  in 2018 was categorized as mild.  We will check echocardiogram again.  Essential hypertension -On lisinopril.  No side effects.  Doing well.  History of stroke -Has been on Coumadin for several years.  He remembers being told that as long as he continue to smoke he would be on Coumadin.  Tobacco use --Continue to encourage cessation.   Shared Decision Making/Informed Consent        Medication Adjustments/Labs and Tests Ordered: Current medicines are reviewed at length with the patient today.  Concerns regarding medicines are outlined above.  Orders Placed This Encounter  Procedures  . EKG 12-Lead  . ECHOCARDIOGRAM COMPLETE   No orders of the defined types were placed in this encounter.   Patient Instructions  Medication Instructions:  The current medical regimen is effective;  continue present plan and medications.  *If you need a refill on your cardiac medications before your next appointment, please call your pharmacy*  Testing/Procedures: Your physician has requested that you have an echocardiogram. Echocardiography is a painless test that uses sound waves to create images of your heart. It provides your doctor with information about the size and shape of your heart and how well your heart's chambers and valves are working. This procedure takes approximately one hour. There are no restrictions for this procedure.  Follow-Up: At [facility], you and your health needs are our priority.  As part of our continuing mission to provide you with  exceptional heart care, we have created designated Provider Care Teams.  These Care Teams include your primary Cardiologist (physician) and Advanced Practice Providers (APPs -  Physician Assistants and Nurse Practitioners) who all work together to provide you with the care you need, when you need it.  We recommend signing up for the patient portal called "MyChart".  Sign up information is provided on this After Visit Summary.  MyChart is used to connect with patients for Virtual Visits (Telemedicine).  Patients are able to view lab/test results, encounter notes, upcoming appointments, etc.  Non-urgent messages can be sent to your provider as well.   To learn more about what you can do with MyChart, go to [email or web address].    Your next appointment:   12 month(s)  The format for your next appointment:   In Person  Provider:   Candee Furbish, MD   Thank you for choosing [facility]!!         Signed, Candee Furbish, MD  09/12/2020 5:08 PM    [place]

## 2020-09-12 NOTE — Patient Instructions (Signed)
Medication Instructions:  The current medical regimen is effective;  continue present plan and medications.  *If you need a refill on your cardiac medications before your next appointment, please call your pharmacy*  Testing/Procedures: Your physician has requested that you have an echocardiogram. Echocardiography is a painless test that uses sound waves to create images of your heart. It provides your doctor with information about the size and shape of your heart and how well your heart's chambers and valves are working. This procedure takes approximately one hour. There are no restrictions for this procedure.  Follow-Up: At [facility], you and your health needs are our priority.  As part of our continuing mission to provide you with exceptional heart care, we have created designated Provider Care Teams.  These Care Teams include your primary Cardiologist (physician) and Advanced Practice Providers (APPs -  Physician Assistants and Nurse Practitioners) who all work together to provide you with the care you need, when you need it.  We recommend signing up for the patient portal called "MyChart".  Sign up information is provided on this After Visit Summary.  MyChart is used to connect with patients for Virtual Visits (Telemedicine).  Patients are able to view lab/test results, encounter notes, upcoming appointments, etc.  Non-urgent messages can be sent to your provider as well.   To learn more about what you can do with MyChart, go to [email or web address].    Your next appointment:   12 month(s)  The format for your next appointment:   In Person  Provider:   Mark Skains, MD   Thank you for choosing [facility]!!      

## 2020-09-19 DIAGNOSIS — H1131 Conjunctival hemorrhage, right eye: Secondary | ICD-10-CM | POA: Diagnosis not present

## 2020-09-27 DIAGNOSIS — Z7901 Long term (current) use of anticoagulants: Secondary | ICD-10-CM | POA: Diagnosis not present

## 2020-10-09 DIAGNOSIS — Z7901 Long term (current) use of anticoagulants: Secondary | ICD-10-CM | POA: Diagnosis not present

## 2020-10-09 DIAGNOSIS — F17200 Nicotine dependence, unspecified, uncomplicated: Secondary | ICD-10-CM | POA: Diagnosis not present

## 2020-10-09 DIAGNOSIS — Z8673 Personal history of transient ischemic attack (TIA), and cerebral infarction without residual deficits: Secondary | ICD-10-CM | POA: Diagnosis not present

## 2020-10-09 DIAGNOSIS — S39012A Strain of muscle, fascia and tendon of lower back, initial encounter: Secondary | ICD-10-CM | POA: Diagnosis not present

## 2020-10-09 DIAGNOSIS — I10 Essential (primary) hypertension: Secondary | ICD-10-CM | POA: Diagnosis not present

## 2020-10-10 ENCOUNTER — Ambulatory Visit (HOSPITAL_COMMUNITY): Payer: Medicare Other | Attending: Cardiology

## 2020-10-10 ENCOUNTER — Other Ambulatory Visit: Payer: Self-pay

## 2020-10-10 DIAGNOSIS — I359 Nonrheumatic aortic valve disorder, unspecified: Secondary | ICD-10-CM | POA: Diagnosis not present

## 2020-10-10 LAB — ECHOCARDIOGRAM COMPLETE
Area-P 1/2: 3.74 cm2
P 1/2 time: 342 msec
S' Lateral: 3.4 cm

## 2020-10-15 ENCOUNTER — Telehealth: Payer: Self-pay

## 2020-10-15 NOTE — Telephone Encounter (Signed)
-----   Message from Jerline Pain, MD sent at 10/15/2020  3:42 PM EST ----- Normal left ventricular pump function.  Aortic regurgitation is now mild.  Reassuring. Candee Furbish, MD

## 2020-10-15 NOTE — Telephone Encounter (Signed)
The patient has been notified of the result and verbalized understanding.  All questions (if any) were answered. Leanord Hawking, RN 10/15/2020 3:44 PM

## 2020-10-23 ENCOUNTER — Other Ambulatory Visit: Payer: Self-pay | Admitting: Cardiology

## 2020-11-19 DIAGNOSIS — Z7901 Long term (current) use of anticoagulants: Secondary | ICD-10-CM | POA: Diagnosis not present

## 2020-11-20 DIAGNOSIS — K639 Disease of intestine, unspecified: Secondary | ICD-10-CM | POA: Diagnosis not present

## 2020-11-20 DIAGNOSIS — Z86010 Personal history of colonic polyps: Secondary | ICD-10-CM | POA: Diagnosis not present

## 2020-11-20 DIAGNOSIS — Z7901 Long term (current) use of anticoagulants: Secondary | ICD-10-CM | POA: Diagnosis not present

## 2020-11-20 DIAGNOSIS — L290 Pruritus ani: Secondary | ICD-10-CM | POA: Diagnosis not present

## 2020-11-20 DIAGNOSIS — Z862 Personal history of diseases of the blood and blood-forming organs and certain disorders involving the immune mechanism: Secondary | ICD-10-CM | POA: Diagnosis not present

## 2020-11-20 DIAGNOSIS — R195 Other fecal abnormalities: Secondary | ICD-10-CM | POA: Diagnosis not present

## 2020-12-03 DIAGNOSIS — H401112 Primary open-angle glaucoma, right eye, moderate stage: Secondary | ICD-10-CM | POA: Diagnosis not present

## 2020-12-03 DIAGNOSIS — Z961 Presence of intraocular lens: Secondary | ICD-10-CM | POA: Diagnosis not present

## 2020-12-03 DIAGNOSIS — H401123 Primary open-angle glaucoma, left eye, severe stage: Secondary | ICD-10-CM | POA: Diagnosis not present

## 2020-12-18 DIAGNOSIS — Z7901 Long term (current) use of anticoagulants: Secondary | ICD-10-CM | POA: Diagnosis not present

## 2021-01-18 DIAGNOSIS — Z7901 Long term (current) use of anticoagulants: Secondary | ICD-10-CM | POA: Diagnosis not present

## 2021-01-24 DIAGNOSIS — Z23 Encounter for immunization: Secondary | ICD-10-CM | POA: Diagnosis not present

## 2021-02-18 DIAGNOSIS — Z7901 Long term (current) use of anticoagulants: Secondary | ICD-10-CM | POA: Diagnosis not present

## 2021-03-20 DIAGNOSIS — Z7901 Long term (current) use of anticoagulants: Secondary | ICD-10-CM | POA: Diagnosis not present

## 2021-04-02 DIAGNOSIS — H401123 Primary open-angle glaucoma, left eye, severe stage: Secondary | ICD-10-CM | POA: Diagnosis not present

## 2021-04-02 DIAGNOSIS — H401112 Primary open-angle glaucoma, right eye, moderate stage: Secondary | ICD-10-CM | POA: Diagnosis not present

## 2021-04-19 DIAGNOSIS — Z7901 Long term (current) use of anticoagulants: Secondary | ICD-10-CM | POA: Diagnosis not present

## 2021-05-03 DIAGNOSIS — Z7901 Long term (current) use of anticoagulants: Secondary | ICD-10-CM | POA: Diagnosis not present

## 2021-06-25 DIAGNOSIS — Z03818 Encounter for observation for suspected exposure to other biological agents ruled out: Secondary | ICD-10-CM | POA: Diagnosis not present

## 2021-06-26 DIAGNOSIS — Z6823 Body mass index (BMI) 23.0-23.9, adult: Secondary | ICD-10-CM | POA: Diagnosis not present

## 2021-06-26 DIAGNOSIS — Z7901 Long term (current) use of anticoagulants: Secondary | ICD-10-CM | POA: Diagnosis not present

## 2021-06-26 DIAGNOSIS — R059 Cough, unspecified: Secondary | ICD-10-CM | POA: Diagnosis not present

## 2021-07-24 DIAGNOSIS — Z7901 Long term (current) use of anticoagulants: Secondary | ICD-10-CM | POA: Diagnosis not present

## 2021-07-27 DIAGNOSIS — Z23 Encounter for immunization: Secondary | ICD-10-CM | POA: Diagnosis not present

## 2021-08-05 DIAGNOSIS — H401112 Primary open-angle glaucoma, right eye, moderate stage: Secondary | ICD-10-CM | POA: Diagnosis not present

## 2021-08-05 DIAGNOSIS — H401123 Primary open-angle glaucoma, left eye, severe stage: Secondary | ICD-10-CM | POA: Diagnosis not present

## 2021-08-20 DIAGNOSIS — M25511 Pain in right shoulder: Secondary | ICD-10-CM | POA: Diagnosis not present

## 2021-08-22 DIAGNOSIS — Z7901 Long term (current) use of anticoagulants: Secondary | ICD-10-CM | POA: Diagnosis not present

## 2021-09-02 DIAGNOSIS — M7541 Impingement syndrome of right shoulder: Secondary | ICD-10-CM | POA: Diagnosis not present

## 2021-09-02 DIAGNOSIS — M25511 Pain in right shoulder: Secondary | ICD-10-CM | POA: Diagnosis not present

## 2021-09-19 DIAGNOSIS — Z7901 Long term (current) use of anticoagulants: Secondary | ICD-10-CM | POA: Diagnosis not present

## 2021-10-04 DIAGNOSIS — Z7901 Long term (current) use of anticoagulants: Secondary | ICD-10-CM | POA: Diagnosis not present

## 2021-10-29 ENCOUNTER — Other Ambulatory Visit: Payer: Self-pay | Admitting: Cardiology

## 2021-11-01 DIAGNOSIS — Z7901 Long term (current) use of anticoagulants: Secondary | ICD-10-CM | POA: Diagnosis not present

## 2021-11-21 ENCOUNTER — Other Ambulatory Visit: Payer: Self-pay | Admitting: Cardiology

## 2021-11-29 DIAGNOSIS — Z7901 Long term (current) use of anticoagulants: Secondary | ICD-10-CM | POA: Diagnosis not present

## 2021-11-30 ENCOUNTER — Other Ambulatory Visit: Payer: Self-pay | Admitting: Cardiology

## 2021-12-04 DIAGNOSIS — H401112 Primary open-angle glaucoma, right eye, moderate stage: Secondary | ICD-10-CM | POA: Diagnosis not present

## 2021-12-04 DIAGNOSIS — Z961 Presence of intraocular lens: Secondary | ICD-10-CM | POA: Diagnosis not present

## 2021-12-04 DIAGNOSIS — H401123 Primary open-angle glaucoma, left eye, severe stage: Secondary | ICD-10-CM | POA: Diagnosis not present

## 2021-12-14 DIAGNOSIS — S2232XA Fracture of one rib, left side, initial encounter for closed fracture: Secondary | ICD-10-CM | POA: Diagnosis not present

## 2021-12-16 ENCOUNTER — Telehealth: Payer: Self-pay | Admitting: Cardiology

## 2021-12-16 ENCOUNTER — Other Ambulatory Visit: Payer: Self-pay | Admitting: Cardiology

## 2021-12-16 MED ORDER — LISINOPRIL 10 MG PO TABS: 10.0000 mg | ORAL_TABLET | Freq: Every day | ORAL | 0 refills | Status: DC

## 2021-12-16 NOTE — Telephone Encounter (Signed)
°*  STAT* If patient is at the pharmacy, call can be transferred to refill team.   1. Which medications need to be refilled? (please list name of each medication and dose if known)  lisinopril (ZESTRIL) 10 MG tablet  2. Which pharmacy/location (including street and city if local pharmacy) is medication to be sent to? [id number] - [place], [place] - [address]  3. Do they need a 30 day or 90 day supply? 30 with refills  Patient is out of medication  Patient is scheduled to see Dr. Marlou Porch 12/23/21

## 2021-12-16 NOTE — Telephone Encounter (Signed)
Pt's medication was sent to pt's pharmacy as requested. Confirmation received.  °

## 2021-12-23 ENCOUNTER — Ambulatory Visit: Payer: Medicare Other | Admitting: Cardiology

## 2021-12-24 ENCOUNTER — Other Ambulatory Visit: Payer: Self-pay | Admitting: Cardiology

## 2021-12-27 DIAGNOSIS — Z7901 Long term (current) use of anticoagulants: Secondary | ICD-10-CM | POA: Diagnosis not present

## 2022-01-01 NOTE — Progress Notes (Signed)
?Cardiology Office Note:   ? ?Date:  01/02/2022  ? ?ID:  Ricky Wagner, DOB 1946/07/31, MRN [id number] ? ?PCP:  Aretta Nip, MD  ?[facility] Cardiologist:  Candee Furbish, MD  ?[facility] Electrophysiologist:  None  ? ?Referring MD: Aretta Nip, MD  ? ? ?History of Present Illness:   ? ?Ricky Wagner is a 76 y.o. male here for the follow-up of moderate aortic regurgitation.  In fact, 2018 echocardiogram showed mild aortic regurgitation with normal EF. ? 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 ? ?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  Was on chronic Coumadin.  He did not show any signs of atrial fibrillation.  He was to continue with the Coumadin. ? ?Today, he is doing well. He recently fractured his ribs. He was cutting wood last week when he fell back and hit his chest. Since the injury, his breathing is improved but he continues to feel chest pain.  ? ?He lost his younger brother last year at 72 years old. His brother underwent 2 open heart surgeries. ?  ?He has cut back on drinking Coca[employer]. He has also reduced his smoking to 0.5 ppd. He is hoping to use a nicotine patch to help with cessation.  ? ?He denies any palpitations, lightheadedness, headaches, syncope, orthopnea, PND, lower extremity edema or exertional symptoms.  ? ?Past Medical History:  ?Diagnosis Date  ? Aortic insufficiency   ? Aortic valve disorder   ? mild to moderate- ECHO 02/23/09. Normal ejection fraction  ? Back pain   ? Cancer [facility])   ? Prostate cancer, neoplasm  ? Chest pain   ?  No CAD after Nuclear stress test-02/23/09-anteroseptal ischemia, abnormal, 7 minutes and 45 seconds ([facility])  ? Chronic kidney disease   ? Kidnye stone s/p Lithotripsy  ? CVA (cerebral vascular accident) [facility])   ? Heart murmur   ? Rheumatic fever  ? Hypercholesteremia   ? Hyperlipidemia   ? 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 Anterior leaflet MV  ? Polyp of colon   ? Rheumatic fever   ? Stroke [facility]) 1991  ? in 1991 (? 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 Erling Cruz)  ? ? ?Past Surgical History:  ?Procedure Laterality Date  ? colonsocopy    ? ESOPHAGOGASTRODUODENOSCOPY    ? TEE WITHOUT CARDIOVERSION N/A 01/12/2013  ? Procedure: TRANSESOPHAGEAL ECHOCARDIOGRAM (TEE);  Surgeon: Candee Furbish, MD;  Location: [facility] ENDOSCOPY;  Service: Cardiovascular;  Laterality: N/A;  h&p in file-hope  ? ? ?Current Medications: ?Current Meds  ?Medication Sig  ? ferrous sulfate 325 (65 FE) MG tablet Take 325 mg by mouth daily with breakfast.  ? lisinopril (ZESTRIL) 10 MG tablet Take 1 tablet (10 mg total) by mouth daily. Please keep upcoming appt in March 2023 with Cardiologist before anymore refills. Thank you Final Attempt  ? LUMIGAN 0.01 % SOLN   ? timolol (TIMOPTIC) 0.5 % ophthalmic solution Place 1 drop into both eyes 2 (two) times daily.   ? warfarin (COUMADIN) 5 MG tablet Take 5 mg by mouth daily.   ?  ? ?Allergies:   Patient has no known allergies.  ? ?Social History  ? ?Socioeconomic History  ? Marital status: Married  ?  Spouse name: Not on file  ? Number of children: Not on file  ? Years of education: Not on file  ?  Highest education level: Not on file  ?Occupational History  ? Not on file  ?Tobacco Use  ? Smoking status: Every Day  ?  Packs/day: 1.00  ?  Types: Cigarettes  ? Smokeless tobacco: Never  ?Substance and Sexual Activity  ? Alcohol use: Yes  ?  Alcohol/week: 1.0 standard drink  ?  Types: 1 Cans of beer per week  ?  Comment: occasional  ? Drug use: No  ? Sexual activity: Not on file  ?Other Topics Concern  ? Not on file  ?Social History Narrative  ? Not on file  ? ?Social Determinants of Health  ? ?Financial Resource Strain: Not on file  ?Food Insecurity: Not on file  ?Transportation Needs: Not on file  ?Physical Activity: Not on file  ?Stress: Not on file  ?Social Connections: Not on file  ?  ? ?Family History: ?The patient's  family history includes Cancer in his father and mother; Diabetes in his mother; Heart disease in his brother; Hypertension in his sister and sister. ? ?ROS:   ?Please see the history of present illness.    ?(+) Chest pain ?(+) Tobacco use ?All other systems reviewed and are negative. ? ?EKGs/Labs/Other Studies Reviewed:   ? ?The following studies were reviewed today: ?Echo 10/10/20 ? 1. Left ventricular ejection fraction, by estimation, is 55 to 60%. The  ?left ventricle has normal function.  ? 2. The left ventricle has no regional wall motion abnormalities.  ? 3. Left ventricular diastolic parameters are consistent with Grade I  ?diastolic dysfunction (impaired relaxation).  ? 4. Right ventricular systolic function is normal. The right ventricular  ?size is normal.  ? 5. The mitral valve is normal in structure. Mild mitral valve  ?regurgitation.  ? 6. The aortic valve is tricuspid. Aortic valve regurgitation is mild.  ? 7. The inferior vena cava is normal in size with greater than 50%  ?respiratory variability, suggesting right atrial pressure of 3 mmHg.  ? ?Comparison(s): No significant change from prior study. ? ?ECHO 2018: ?- Left ventricle: The cavity size was normal. Systolic function was  ?  normal. The estimated ejection fraction was in the range of 55%  ?  to 60%. Wall motion was normal; there were no regional wall  ?  motion abnormalities.  ?- Aortic valve: Mobile calcified sturcture at base of anterior  ?  mitral leaflt in LVOTmay be degenerative material from aortic  ?  valve. There was mild regurgitation.  ?- Mitral valve: There was mild regurgitation.  ?- Atrial septum: No defect or patent foramen ovale was identified.  ? 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 ?Recent Labs: ?No results found for requested labs within last 8760 hours.   ?Recent Lipid Panel ?No results found for: CHOL, TRIG, HDL, CHOLHDL, VLDL, LDLCALC, LDLDIRECT ? ? ?Risk Assessment/Calculations:   ?  ? ? ?Physical Exam:   ? ?VS:  BP (!) 146/78   Pulse 72   Ht '5\' 8"'$  (1.727 m)   Wt 153 lb 12.8 oz (69.8 kg)   SpO2 97%   BMI 23.39 kg/m?    ? ?Wt Readings from Last 3 Encounters:  ?01/02/22 153 lb 12.8 oz (69.8 kg)  ?09/12/20 146 lb 12.8 oz (66.6 kg)  ?08/22/19 154 lb (69.9 kg)  ?  ? ?GEN:  Well nourished, well developed in no acute distress ?  HEENT: Normal ?NECK: No JVD; No carotid bruits ?LYMPHATICS: No lymphadenopathy ?CARDIAC: RRR, 2/6 S murmur,no  rubs, gallops ?RESPIRATORY:  Clear to auscultation without rales, wheezing or rhonchi  ?ABDOMEN: Soft, non-tender, non-distended ?MUSCULOSKELETAL:  No edema; No deformity  ?SKIN: Warm and dry ?NEUROLOGIC:  Alert and oriented x 3 ?PSYCHIATRIC:  Normal affect  ? ?ASSESSMENT:   ? ?1. Aortic valve disorder   ?2. Papillary fibroelastoma of heart   ?3. History of CVA (cerebrovascular accident)   ?4. Primary hypertension   ?5. Tobacco use   ? ? ?PLAN:   ? ?Aortic valve disorder ?Previous echocardiogram 2021 showed mild aortic regurgitation.  Repeating echo. ? ?Papillary fibroelastoma of heart ?Noted on anterior leaflet of mitral valve.  We will repeat echocardiogram. ? ?History of CVA (cerebrovascular accident) ?Has been on Coumadin for several years.  We have discussed in the past stopping however he wished to continue.  He is not having any bleeding.  Hemoglobin 15.2 creatinine 1.1.  Followed closely in Coumadin clinic. ? ?Primary hypertension ?Continue with lisinopril 10 mg once a day.  Blood pressure slightly elevated today.  Continue to monitor. ? ?Tobacco use ?Continue to encourage cessation.  He is down to 1/2 pack/day.  His brother recently died with heart disease. ? ? ? ?Shared Decision Making/Informed Consent   ?   ? ? ?Medication Adjustments/Labs and Tests Ordered: ?Current medicines are reviewed at length with the patient today.   Concerns regarding medicines are outlined above.  ?Orders Placed This Encounter  ?Procedures  ? EKG 12-Lead  ? ECHOCARDIOGRAM COMPLETE  ? ?No orders of the defined types were placed in this encounter. ? ? ?

## 2022-01-02 ENCOUNTER — Encounter: Payer: Self-pay | Admitting: Cardiology

## 2022-01-02 ENCOUNTER — Ambulatory Visit (INDEPENDENT_AMBULATORY_CARE_PROVIDER_SITE_OTHER): Payer: Medicare Other | Admitting: Cardiology

## 2022-01-02 ENCOUNTER — Other Ambulatory Visit: Payer: Self-pay

## 2022-01-02 VITALS — BP 146/78 | HR 72 | Ht 68.0 in | Wt 153.8 lb

## 2022-01-02 DIAGNOSIS — I10 Essential (primary) hypertension: Secondary | ICD-10-CM | POA: Diagnosis not present

## 2022-01-02 DIAGNOSIS — Z8673 Personal history of transient ischemic attack (TIA), and cerebral infarction without residual deficits: Secondary | ICD-10-CM

## 2022-01-02 DIAGNOSIS — D151 Benign neoplasm of heart: Secondary | ICD-10-CM | POA: Diagnosis not present

## 2022-01-02 DIAGNOSIS — Z720 Tobacco use: Secondary | ICD-10-CM | POA: Diagnosis not present

## 2022-01-02 DIAGNOSIS — I359 Nonrheumatic aortic valve disorder, unspecified: Secondary | ICD-10-CM | POA: Diagnosis not present

## 2022-01-02 NOTE — Assessment & Plan Note (Signed)
Has been on Coumadin for several years.  We have discussed in the past stopping however he wished to continue.  He is not having any bleeding.  Hemoglobin 15.2 creatinine 1.1.  Followed closely in Coumadin clinic. ?

## 2022-01-02 NOTE — Patient Instructions (Signed)
Medication Instructions:  The current medical regimen is effective;  continue present plan and medications.  *If you need a refill on your cardiac medications before your next appointment, please call your pharmacy*  Testing/Procedures: Your physician has requested that you have an echocardiogram. Echocardiography is a painless test that uses sound waves to create images of your heart. It provides your doctor with information about the size and shape of your heart and how well your heart's chambers and valves are working. This procedure takes approximately one hour. There are no restrictions for this procedure.  Follow-Up: At [facility], you and your health needs are our priority.  As part of our continuing mission to provide you with exceptional heart care, we have created designated Provider Care Teams.  These Care Teams include your primary Cardiologist (physician) and Advanced Practice Providers (APPs -  Physician Assistants and Nurse Practitioners) who all work together to provide you with the care you need, when you need it.  We recommend signing up for the patient portal called "MyChart".  Sign up information is provided on this After Visit Summary.  MyChart is used to connect with patients for Virtual Visits (Telemedicine).  Patients are able to view lab/test results, encounter notes, upcoming appointments, etc.  Non-urgent messages can be sent to your provider as well.   To learn more about what you can do with MyChart, go to [email or web address].    Your next appointment:   1 year(s)  The format for your next appointment:   In Person  Provider:   Mark Skains, MD     Thank you for choosing [facility]!!    

## 2022-01-02 NOTE — Assessment & Plan Note (Signed)
Noted on anterior leaflet of mitral valve.  We will repeat echocardiogram. ?

## 2022-01-02 NOTE — Assessment & Plan Note (Signed)
Continue to encourage cessation.  He is down to 1/2 pack/day.  His brother recently died with heart disease. ?

## 2022-01-02 NOTE — Assessment & Plan Note (Signed)
Previous echocardiogram 2021 showed mild aortic regurgitation.  Repeating echo. ?

## 2022-01-02 NOTE — Assessment & Plan Note (Signed)
Continue with lisinopril 10 mg once a day.  Blood pressure slightly elevated today.  Continue to monitor. ?

## 2022-01-16 ENCOUNTER — Other Ambulatory Visit: Payer: Self-pay | Admitting: Cardiology

## 2022-01-22 ENCOUNTER — Ambulatory Visit (HOSPITAL_COMMUNITY): Payer: Medicare Other | Attending: Cardiovascular Disease

## 2022-01-22 DIAGNOSIS — I359 Nonrheumatic aortic valve disorder, unspecified: Secondary | ICD-10-CM | POA: Diagnosis not present

## 2022-01-22 LAB — ECHOCARDIOGRAM COMPLETE
AV Vena cont: 0.4 cm
Area-P 1/2: 2.87 cm2
Calc EF: 55.5 %
P 1/2 time: 384 msec
S' Lateral: 3.4 cm
Single Plane A2C EF: 53.7 %
Single Plane A4C EF: 56.2 %

## 2022-02-03 DIAGNOSIS — Z7901 Long term (current) use of anticoagulants: Secondary | ICD-10-CM | POA: Diagnosis not present

## 2022-03-03 DIAGNOSIS — Z7901 Long term (current) use of anticoagulants: Secondary | ICD-10-CM | POA: Diagnosis not present

## 2022-03-26 DIAGNOSIS — H401123 Primary open-angle glaucoma, left eye, severe stage: Secondary | ICD-10-CM | POA: Diagnosis not present

## 2022-03-26 DIAGNOSIS — H401112 Primary open-angle glaucoma, right eye, moderate stage: Secondary | ICD-10-CM | POA: Diagnosis not present

## 2022-04-08 DIAGNOSIS — Z7901 Long term (current) use of anticoagulants: Secondary | ICD-10-CM | POA: Diagnosis not present

## 2022-05-08 DIAGNOSIS — Z7901 Long term (current) use of anticoagulants: Secondary | ICD-10-CM | POA: Diagnosis not present

## 2022-06-05 DIAGNOSIS — Z7901 Long term (current) use of anticoagulants: Secondary | ICD-10-CM | POA: Diagnosis not present

## 2022-07-03 DIAGNOSIS — Z7901 Long term (current) use of anticoagulants: Secondary | ICD-10-CM | POA: Diagnosis not present

## 2022-07-18 DIAGNOSIS — Z23 Encounter for immunization: Secondary | ICD-10-CM | POA: Diagnosis not present

## 2022-07-19 DIAGNOSIS — Z23 Encounter for immunization: Secondary | ICD-10-CM | POA: Diagnosis not present

## 2022-08-28 DIAGNOSIS — M545 Low back pain, unspecified: Secondary | ICD-10-CM | POA: Diagnosis not present

## 2022-08-28 DIAGNOSIS — Z7901 Long term (current) use of anticoagulants: Secondary | ICD-10-CM | POA: Diagnosis not present

## 2022-08-29 DIAGNOSIS — M5451 Vertebrogenic low back pain: Secondary | ICD-10-CM | POA: Diagnosis not present

## 2022-09-03 DIAGNOSIS — M5451 Vertebrogenic low back pain: Secondary | ICD-10-CM | POA: Diagnosis not present

## 2022-09-09 DIAGNOSIS — M5451 Vertebrogenic low back pain: Secondary | ICD-10-CM | POA: Diagnosis not present

## 2022-09-17 DIAGNOSIS — M5451 Vertebrogenic low back pain: Secondary | ICD-10-CM | POA: Diagnosis not present

## 2022-09-29 DIAGNOSIS — Z7901 Long term (current) use of anticoagulants: Secondary | ICD-10-CM | POA: Diagnosis not present

## 2022-11-25 DIAGNOSIS — Z7901 Long term (current) use of anticoagulants: Secondary | ICD-10-CM | POA: Diagnosis not present

## 2022-12-09 DIAGNOSIS — R718 Other abnormality of red blood cells: Secondary | ICD-10-CM | POA: Diagnosis not present

## 2022-12-09 DIAGNOSIS — Z7901 Long term (current) use of anticoagulants: Secondary | ICD-10-CM | POA: Diagnosis not present

## 2022-12-09 DIAGNOSIS — Z86010 Personal history of colonic polyps: Secondary | ICD-10-CM | POA: Diagnosis not present

## 2022-12-09 DIAGNOSIS — I3489 Other nonrheumatic mitral valve disorders: Secondary | ICD-10-CM | POA: Diagnosis not present

## 2022-12-09 DIAGNOSIS — Z862 Personal history of diseases of the blood and blood-forming organs and certain disorders involving the immune mechanism: Secondary | ICD-10-CM | POA: Diagnosis not present

## 2022-12-11 DIAGNOSIS — Z03818 Encounter for observation for suspected exposure to other biological agents ruled out: Secondary | ICD-10-CM | POA: Diagnosis not present

## 2022-12-11 DIAGNOSIS — J209 Acute bronchitis, unspecified: Secondary | ICD-10-CM | POA: Diagnosis not present

## 2022-12-11 DIAGNOSIS — R051 Acute cough: Secondary | ICD-10-CM | POA: Diagnosis not present

## 2022-12-24 ENCOUNTER — Encounter: Payer: Self-pay | Admitting: Cardiology

## 2022-12-24 ENCOUNTER — Ambulatory Visit: Payer: Medicare Other | Attending: Cardiology | Admitting: Cardiology

## 2022-12-24 VITALS — BP 122/58 | HR 84 | Ht 68.0 in | Wt 154.8 lb

## 2022-12-24 DIAGNOSIS — I359 Nonrheumatic aortic valve disorder, unspecified: Secondary | ICD-10-CM | POA: Insufficient documentation

## 2022-12-24 DIAGNOSIS — I10 Essential (primary) hypertension: Secondary | ICD-10-CM | POA: Insufficient documentation

## 2022-12-24 DIAGNOSIS — D151 Benign neoplasm of heart: Secondary | ICD-10-CM | POA: Diagnosis not present

## 2022-12-24 DIAGNOSIS — Z8673 Personal history of transient ischemic attack (TIA), and cerebral infarction without residual deficits: Secondary | ICD-10-CM | POA: Diagnosis not present

## 2022-12-24 DIAGNOSIS — Z720 Tobacco use: Secondary | ICD-10-CM | POA: Insufficient documentation

## 2022-12-24 NOTE — Progress Notes (Signed)
Cardiology Office Note:    Date:  12/24/2022   ID:  Ricky Wagner, DOB Aug 07, 1946, MRN [id number]  PCP:  Aretta Nip, MD  [facility] Cardiologist:  Candee Furbish, MD  [facility] Electrophysiologist:  None   Referring MD: Aretta Nip, MD    History of Present Illness:    Ricky Wagner is a 77 y.o. male here for the follow-up of moderate aortic regurgitation.  In fact, 2018 echocardiogram showed mild aortic regurgitation with normal EF.  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  Was on chronic Coumadin.  He did not show any signs of atrial fibrillation.  He was to continue with the Coumadin.  He is doing well. He previously fractured his ribs. He was cutting wood last week when he fell back and hit his chest. Since the injury, his breathing is improved but he continues to feel chest pain.   He lost his younger brother last year at 17 years old. His brother underwent 2 open heart surgeries.   He has cut back on drinking Coca-Cola. He has also reduced his smoking to 0.5 ppd. He is hoping to use a nicotine patch to help with cessation.   Overall without any fevers chills nausea vomiting syncope bleeding. Cough  Past Medical History:  Diagnosis Date   Aortic insufficiency    Aortic valve disorder    mild to moderate- ECHO 02/23/09. Normal ejection fraction   Back pain    Cancer (HCC)    Prostate cancer, neoplasm   Chest pain     No CAD after Nuclear stress test-02/23/09-anteroseptal ischemia, abnormal, 7 minutes and 45 seconds ([facility])   Chronic kidney disease    Kidnye stone s/p Lithotripsy   CVA (cerebral vascular accident) ([place])    Heart murmur    Rheumatic fever   Hypercholesteremia    Hyperlipidemia    Papillary fibroelastoma of heart 08/05/2013   Anterior leaflet MV   Polyp of colon     Rheumatic fever    Stroke ([place]) 1991   in 1991 (? 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 Erling Cruz)    Past Surgical History:  Procedure Laterality Date   colonsocopy     ESOPHAGOGASTRODUODENOSCOPY     TEE WITHOUT CARDIOVERSION N/A 01/12/2013   Procedure: TRANSESOPHAGEAL ECHOCARDIOGRAM (TEE);  Surgeon: Candee Furbish, MD;  Location: [facility] ENDOSCOPY;  Service: Cardiovascular;  Laterality: N/A;  h&p in file-hope    Current Medications: Current Meds  Medication Sig   ferrous sulfate 325 (65 FE) MG tablet Take 325 mg by mouth daily with breakfast.   lisinopril (ZESTRIL) 10 MG tablet TAKE 1 TABLET (10 MG TOTAL) BY MOUTH DAILY. PLEASE KEEP UPCOMING APPT IN MARCH 2023   LUMIGAN 0.01 % SOLN    timolol (TIMOPTIC) 0.5 % ophthalmic solution Place 1 drop into both eyes 2 (two) times daily.    warfarin (COUMADIN) 5 MG tablet Take 5 mg by mouth daily.      Allergies:   Patient has no known allergies.   Social History   Socioeconomic History   Marital status: Married    Spouse name: Not on file   Number of children: Not on file   Years of education: Not on file   Highest education level: Not on file  Occupational History   Not on file  Tobacco Use   Smoking status: Every Day    Packs/day: 1.00    Types: Cigarettes   Smokeless tobacco: Never  Substance and Sexual Activity   Alcohol use: Yes    Alcohol/week: 1.0 standard drink of alcohol    Types: 1 Cans of beer per week    Comment: occasional   Drug use: No   Sexual activity: Not on file  Other Topics Concern   Not on file  Social History Narrative   Not on file   Social Determinants of Health   Financial Resource Strain: Not on file  Food Insecurity: Not on file  Transportation Needs: Not on file  Physical Activity: Not on file  Stress: Not on file  Social Connections: Not on file     Family History: The patient's family history includes Cancer in his father and mother; Diabetes in his mother; Heart disease in his brother;  Hypertension in his sister and sister.  ROS:   Please see the history of present illness.    (+) Chest pain (+) Tobacco use All other systems reviewed and are negative.  EKGs/Labs/Other Studies Reviewed:    The following studies were reviewed today: Echo 10/10/20  1. Left ventricular ejection fraction, by estimation, is 55 to 60%. The  left ventricle has normal function.   2. The left ventricle has no regional wall motion abnormalities.   3. Left ventricular diastolic parameters are consistent with Grade I  diastolic dysfunction (impaired relaxation).   4. Right ventricular systolic function is normal. The right ventricular  size is normal.   5. The mitral valve is normal in structure. Mild mitral valve  regurgitation.   6. The aortic valve is tricuspid. Aortic valve regurgitation is mild.   7. The inferior vena cava is normal in size with greater than 50%  respiratory variability, suggesting right atrial pressure of 3 mmHg.   Comparison(s): No significant change from prior study.  ECHO 2018: - Left ventricle: The cavity size was normal. Systolic function was    normal. The estimated ejection fraction was in the range of 55%    to 60%. Wall motion was normal; there were no regional wall    motion abnormalities.  - Aortic valve: Mobile calcified sturcture at base of anterior    mitral leaflt in LVOTmay be degenerative material from aortic    valve. There was mild regurgitation.  - Mitral valve: There was mild regurgitation.  - Atrial septum: No defect or patent foramen ovale was identified.   EKG:  EKG was personally reviewed 12/24/2022: Sinus rhythm 81 left anterior fascicular block left ventricular perjury with nonspecific ST-T wave changes. 01/02/22: Sinus rhythm, rate 72 bpm; non-specific ST-T wave changes and LAFB 09/12/20: sinus rhythm first-degree AV block PR interval 218 ms left anterior fascicular block  Recent Labs: No results found for requested labs within last 365  days.  Recent Lipid Panel No results found for: "CHOL", "TRIG", "HDL", "CHOLHDL", "VLDL", "LDLCALC", "LDLDIRECT"   Risk Assessment/Calculations:       Physical Exam:    VS:  BP (!) 122/58   Pulse 84   Ht '5\' 8"'$  (1.727 m)   Wt 154 lb 12.8 oz (70.2 kg)   SpO2 99%   BMI 23.54 kg/m     Wt Readings from Last 3 Encounters:  12/24/22 154 lb 12.8 oz (70.2 kg)  01/02/22 153 lb 12.8 oz (69.8 kg)  09/12/20 146 lb 12.8 oz (66.6 kg)     GEN:  Well nourished, well  developed in no acute distress HEENT: Normal NECK: No JVD; No carotid bruits LYMPHATICS: No lymphadenopathy CARDIAC: RRR, 2/6 systolic murmur no  rubs, gallops RESPIRATORY:  Clear to auscultation without rales, wheezing or rhonchi  ABDOMEN: Soft, non-tender, non-distended MUSCULOSKELETAL:  No edema; No deformity  SKIN: Warm and dry NEUROLOGIC:  Alert and oriented x 3 PSYCHIATRIC:  Normal affect   ASSESSMENT:    1. Aortic valve disorder   2. Primary hypertension   3. Papillary fibroelastoma of heart   4. Tobacco use   5. History of CVA (cerebrovascular accident)      PLAN:     Aortic valve disorder Previous echocardiogram 2021 showed mild aortic regurgitation.  No changes clinically.  Papillary fibroelastoma of heart Noted on anterior leaflet of mitral valve.  Has been stable on echocardiogram.  History of CVA (cerebrovascular accident) Has been on Coumadin for several years since 1991, Dr. Erling Cruz.  We have discussed in the past stopping however he wished to continue.  He is not having any bleeding.  He does use machinery, chainsaws.  We talked about transitioning over to aspirin daily or perhaps clopidogrel daily.  Hemoglobin 15.2 creatinine 1.1.  Followed closely in Coumadin clinic.  No changes made today.  Primary hypertension Continue with lisinopril 10 mg once a day.  Excellent control..  Continue to monitor.  Tobacco use Continue to encourage cessation.  He is down to 1/2 pack/day.  His brother recently  died with heart disease.  Continue to encourage cessation.    Shared Decision Making/Informed Consent        Medication Adjustments/Labs and Tests Ordered: Current medicines are reviewed at length with the patient today.  Concerns regarding medicines are outlined above.  Orders Placed This Encounter  Procedures   EKG 12-Lead   No orders of the defined types were placed in this encounter.   Patient Instructions  Medication Instructions:   Your physician recommends that you continue on your current medications as directed. Please refer to the Current Medication list given to you today.   *If you need a refill on your cardiac medications before your next appointment, please call your pharmacy*   Lab Work:  None ordered.  If you have labs (blood work) drawn today and your tests are completely normal, you will receive your results only by: [place] (if you have MyChart) OR A paper copy in the mail If you have any lab test that is abnormal or we need to change your treatment, we will call you to review the results.   Testing/Procedures:  None ordered.   Follow-Up: At [facility], you and your health needs are our priority.  As part of our continuing mission to provide you with exceptional heart care, we have created designated Provider Care Teams.  These Care Teams include your primary Cardiologist (physician) and Advanced Practice Providers (APPs -  Physician Assistants and Nurse Practitioners) who all work together to provide you with the care you need, when you need it.  We recommend signing up for the patient portal called "MyChart".  Sign up information is provided on this After Visit Summary.  MyChart is used to connect with patients for Virtual Visits (Telemedicine).  Patients are able to view lab/test results, encounter notes, upcoming appointments, etc.  Non-urgent messages can be sent to your provider as well.   To learn more about what you can do  with MyChart, go to [email or web address].    Your next appointment:   1 year(s)  Provider:  Candee Furbish, MD     Other Instructions  Your physician wants you to follow-up in: 1 year with Dr. Marlou Porch.  You will receive a reminder letter in the mail two months in advance. If you don't receive a letter, please call our office to schedule the follow-up appointment.     I,Mykaella Javier,acting as a scribe for [employer], MD.,have documented all relevant documentation on the behalf of Candee Furbish, MD,as directed by  Candee Furbish, MD while in the presence of Candee Furbish, MD.  I, Candee Furbish, MD, have reviewed all documentation for this visit. The documentation on 12/24/22 for the exam, diagnosis, procedures, and orders are all accurate and complete.   Signed, Candee Furbish, MD  12/24/2022 1:42 PM    [facility]

## 2022-12-24 NOTE — Patient Instructions (Signed)
Medication Instructions:   Your physician recommends that you continue on your current medications as directed. Please refer to the Current Medication list given to you today.   *If you need a refill on your cardiac medications before your next appointment, please call your pharmacy*   Lab Work:  None ordered.  If you have labs (blood work) drawn today and your tests are completely normal, you will receive your results only by: [place] (if you have MyChart) OR A paper copy in the mail If you have any lab test that is abnormal or we need to change your treatment, we will call you to review the results.   Testing/Procedures:  None ordered.   Follow-Up: At [facility], you and your health needs are our priority.  As part of our continuing mission to provide you with exceptional heart care, we have created designated Provider Care Teams.  These Care Teams include your primary Cardiologist (physician) and Advanced Practice Providers (APPs -  Physician Assistants and Nurse Practitioners) who all work together to provide you with the care you need, when you need it.  We recommend signing up for the patient portal called "MyChart".  Sign up information is provided on this After Visit Summary.  MyChart is used to connect with patients for Virtual Visits (Telemedicine).  Patients are able to view lab/test results, encounter notes, upcoming appointments, etc.  Non-urgent messages can be sent to your provider as well.   To learn more about what you can do with MyChart, go to [email or web address].    Your next appointment:   1 year(s)  Provider:   Candee Furbish, MD     Other Instructions  Your physician wants you to follow-up in: 1 year with Dr. Marlou Porch.  You will receive a reminder letter in the mail two months in advance. If you don't receive a letter, please call our office to schedule the follow-up appointment.

## 2022-12-29 DIAGNOSIS — H6991 Unspecified Eustachian tube disorder, right ear: Secondary | ICD-10-CM | POA: Diagnosis not present

## 2022-12-29 DIAGNOSIS — H6121 Impacted cerumen, right ear: Secondary | ICD-10-CM | POA: Diagnosis not present

## 2023-01-21 DIAGNOSIS — Z7901 Long term (current) use of anticoagulants: Secondary | ICD-10-CM | POA: Diagnosis not present

## 2023-01-28 DIAGNOSIS — Z09 Encounter for follow-up examination after completed treatment for conditions other than malignant neoplasm: Secondary | ICD-10-CM | POA: Diagnosis not present

## 2023-01-28 DIAGNOSIS — Z86010 Personal history of colonic polyps: Secondary | ICD-10-CM | POA: Diagnosis not present

## 2023-01-28 DIAGNOSIS — D123 Benign neoplasm of transverse colon: Secondary | ICD-10-CM | POA: Diagnosis not present

## 2023-01-30 DIAGNOSIS — D123 Benign neoplasm of transverse colon: Secondary | ICD-10-CM | POA: Diagnosis not present

## 2023-02-09 ENCOUNTER — Other Ambulatory Visit: Payer: Self-pay | Admitting: Cardiology

## 2023-02-10 ENCOUNTER — Other Ambulatory Visit: Payer: Self-pay

## 2023-02-10 MED ORDER — LISINOPRIL 10 MG PO TABS: ORAL_TABLET | ORAL | 3 refills | Status: DC

## 2023-02-10 NOTE — Telephone Encounter (Signed)
Pt's medication was sent to pt's pharmacy as requested. Confirmation received.  °

## 2023-02-11 DIAGNOSIS — Z7901 Long term (current) use of anticoagulants: Secondary | ICD-10-CM | POA: Diagnosis not present

## 2023-03-06 ENCOUNTER — Ambulatory Visit: Payer: Medicare Other | Admitting: Cardiology

## 2023-04-14 DIAGNOSIS — Z7901 Long term (current) use of anticoagulants: Secondary | ICD-10-CM | POA: Diagnosis not present

## 2023-04-22 DIAGNOSIS — Z862 Personal history of diseases of the blood and blood-forming organs and certain disorders involving the immune mechanism: Secondary | ICD-10-CM | POA: Diagnosis not present

## 2023-04-22 DIAGNOSIS — E7800 Pure hypercholesterolemia, unspecified: Secondary | ICD-10-CM | POA: Diagnosis not present

## 2023-04-22 DIAGNOSIS — I10 Essential (primary) hypertension: Secondary | ICD-10-CM | POA: Diagnosis not present

## 2023-04-30 DIAGNOSIS — I359 Nonrheumatic aortic valve disorder, unspecified: Secondary | ICD-10-CM | POA: Diagnosis not present

## 2023-04-30 DIAGNOSIS — Z862 Personal history of diseases of the blood and blood-forming organs and certain disorders involving the immune mechanism: Secondary | ICD-10-CM | POA: Diagnosis not present

## 2023-04-30 DIAGNOSIS — E7800 Pure hypercholesterolemia, unspecified: Secondary | ICD-10-CM | POA: Diagnosis not present

## 2023-04-30 DIAGNOSIS — Z6824 Body mass index (BMI) 24.0-24.9, adult: Secondary | ICD-10-CM | POA: Diagnosis not present

## 2023-04-30 DIAGNOSIS — I10 Essential (primary) hypertension: Secondary | ICD-10-CM | POA: Diagnosis not present

## 2023-04-30 DIAGNOSIS — Z23 Encounter for immunization: Secondary | ICD-10-CM | POA: Diagnosis not present

## 2023-04-30 DIAGNOSIS — Z0000 Encounter for general adult medical examination without abnormal findings: Secondary | ICD-10-CM | POA: Diagnosis not present

## 2023-05-13 DIAGNOSIS — H6123 Impacted cerumen, bilateral: Secondary | ICD-10-CM | POA: Diagnosis not present

## 2023-05-13 DIAGNOSIS — Z6824 Body mass index (BMI) 24.0-24.9, adult: Secondary | ICD-10-CM | POA: Diagnosis not present

## 2023-05-21 DIAGNOSIS — H1131 Conjunctival hemorrhage, right eye: Secondary | ICD-10-CM | POA: Diagnosis not present

## 2023-05-26 DIAGNOSIS — H401123 Primary open-angle glaucoma, left eye, severe stage: Secondary | ICD-10-CM | POA: Diagnosis not present

## 2023-05-26 DIAGNOSIS — H52223 Regular astigmatism, bilateral: Secondary | ICD-10-CM | POA: Diagnosis not present

## 2023-05-26 DIAGNOSIS — H401112 Primary open-angle glaucoma, right eye, moderate stage: Secondary | ICD-10-CM | POA: Diagnosis not present

## 2023-05-26 DIAGNOSIS — H524 Presbyopia: Secondary | ICD-10-CM | POA: Diagnosis not present

## 2023-05-26 DIAGNOSIS — Z961 Presence of intraocular lens: Secondary | ICD-10-CM | POA: Diagnosis not present

## 2023-06-08 DIAGNOSIS — Z7901 Long term (current) use of anticoagulants: Secondary | ICD-10-CM | POA: Diagnosis not present

## 2023-06-30 DIAGNOSIS — Z23 Encounter for immunization: Secondary | ICD-10-CM | POA: Diagnosis not present

## 2023-07-20 DIAGNOSIS — I059 Rheumatic mitral valve disease, unspecified: Secondary | ICD-10-CM | POA: Diagnosis not present

## 2023-07-20 DIAGNOSIS — Z7901 Long term (current) use of anticoagulants: Secondary | ICD-10-CM | POA: Diagnosis not present

## 2023-07-27 DIAGNOSIS — I10 Essential (primary) hypertension: Secondary | ICD-10-CM | POA: Diagnosis not present

## 2023-07-27 DIAGNOSIS — M25512 Pain in left shoulder: Secondary | ICD-10-CM | POA: Diagnosis not present

## 2023-07-30 DIAGNOSIS — M7542 Impingement syndrome of left shoulder: Secondary | ICD-10-CM | POA: Diagnosis not present

## 2023-08-17 DIAGNOSIS — Z7901 Long term (current) use of anticoagulants: Secondary | ICD-10-CM | POA: Diagnosis not present

## 2023-09-09 DIAGNOSIS — M25512 Pain in left shoulder: Secondary | ICD-10-CM | POA: Diagnosis not present

## 2023-10-06 DIAGNOSIS — H401112 Primary open-angle glaucoma, right eye, moderate stage: Secondary | ICD-10-CM | POA: Diagnosis not present

## 2023-10-06 DIAGNOSIS — H401123 Primary open-angle glaucoma, left eye, severe stage: Secondary | ICD-10-CM | POA: Diagnosis not present

## 2023-10-12 DIAGNOSIS — Z7901 Long term (current) use of anticoagulants: Secondary | ICD-10-CM | POA: Diagnosis not present

## 2023-10-12 DIAGNOSIS — Z6824 Body mass index (BMI) 24.0-24.9, adult: Secondary | ICD-10-CM | POA: Diagnosis not present

## 2023-10-12 DIAGNOSIS — J069 Acute upper respiratory infection, unspecified: Secondary | ICD-10-CM | POA: Diagnosis not present

## 2023-12-15 DIAGNOSIS — Z7901 Long term (current) use of anticoagulants: Secondary | ICD-10-CM | POA: Diagnosis not present

## 2023-12-15 DIAGNOSIS — Z8673 Personal history of transient ischemic attack (TIA), and cerebral infarction without residual deficits: Secondary | ICD-10-CM | POA: Diagnosis not present

## 2023-12-15 DIAGNOSIS — I359 Nonrheumatic aortic valve disorder, unspecified: Secondary | ICD-10-CM | POA: Diagnosis not present

## 2023-12-23 ENCOUNTER — Other Ambulatory Visit (HOSPITAL_BASED_OUTPATIENT_CLINIC_OR_DEPARTMENT_OTHER): Payer: Self-pay | Admitting: Family Medicine

## 2023-12-23 DIAGNOSIS — N289 Disorder of kidney and ureter, unspecified: Secondary | ICD-10-CM

## 2023-12-25 ENCOUNTER — Ambulatory Visit (HOSPITAL_BASED_OUTPATIENT_CLINIC_OR_DEPARTMENT_OTHER)
Admission: RE | Admit: 2023-12-25 | Discharge: 2023-12-25 | Disposition: A | Discharge status: Home / Self Care | Source: Ambulatory Visit | Attending: Family Medicine | Admitting: Family Medicine

## 2023-12-25 DIAGNOSIS — N200 Calculus of kidney: Secondary | ICD-10-CM | POA: Insufficient documentation

## 2023-12-25 DIAGNOSIS — N281 Cyst of kidney, acquired: Secondary | ICD-10-CM | POA: Insufficient documentation

## 2023-12-25 DIAGNOSIS — N289 Disorder of kidney and ureter, unspecified: Secondary | ICD-10-CM | POA: Diagnosis present

## 2024-01-12 DIAGNOSIS — Z7901 Long term (current) use of anticoagulants: Secondary | ICD-10-CM | POA: Diagnosis not present

## 2024-01-18 DIAGNOSIS — H401112 Primary open-angle glaucoma, right eye, moderate stage: Secondary | ICD-10-CM | POA: Diagnosis not present

## 2024-01-18 DIAGNOSIS — H401123 Primary open-angle glaucoma, left eye, severe stage: Secondary | ICD-10-CM | POA: Diagnosis not present

## 2024-01-21 ENCOUNTER — Ambulatory Visit: Payer: Medicare Other | Attending: Cardiology | Admitting: Cardiology

## 2024-01-21 ENCOUNTER — Encounter: Payer: Self-pay | Admitting: Cardiology

## 2024-01-21 VITALS — BP 151/72 | HR 68 | Resp 16 | Ht 68.0 in | Wt 157.6 lb

## 2024-01-21 DIAGNOSIS — I359 Nonrheumatic aortic valve disorder, unspecified: Secondary | ICD-10-CM | POA: Diagnosis not present

## 2024-01-21 DIAGNOSIS — I10 Essential (primary) hypertension: Secondary | ICD-10-CM

## 2024-01-21 DIAGNOSIS — D151 Benign neoplasm of heart: Secondary | ICD-10-CM

## 2024-01-21 DIAGNOSIS — Z720 Tobacco use: Secondary | ICD-10-CM

## 2024-01-21 MED ORDER — IRBESARTAN 150 MG PO TABS: 150.0000 mg | ORAL_TABLET | Freq: Every day | ORAL | 3 refills | Status: AC

## 2024-01-21 NOTE — Progress Notes (Signed)
 Cardiology Office Note:  .   Date:  01/21/2024  ID:  Ricky Wagner, DOB [birth year], MRN [id number] PCP: Clayborn Heron, MD  [facility] Providers Cardiologist:  Donato Schultz, MD    History of Present Illness: .   Ricky Wagner is a 78 y.o. male Discussed the use of AI scribe software for clinical note transcription with the patient, who gave verbal consent to proceed.  History of Present Illness Ricky Wagner is a 78 year old male with moderate aortic regurgitation who presents for follow-up.  He has a history of moderate aortic regurgitation, which has been stable on echocardiogram. The last echocardiogram on January 22, 2022, showed mild aortic regurgitation with an ascending aorta measuring 39 millimeters. 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 He wishes to continue this medication.  His blood pressure was noted to be slightly elevated at 143 mmHg at home. He is currently taking irbesartan 75 mg. He has reduced his smoking to half a pack per day.  His LDL was 116, hemoglobin 14.3, and ALT 13, indicating stable liver function. He previously fractured a rib while cutting wood and falling back.     Studies Reviewed: Marland Kitchen   EKG Interpretation Date/Time:  Thursday January 21 2024 11:15:55 EDT Ventricular Rate:  67 PR Interval:  212 QRS Duration:  124 QT Interval:  402 QTC Calculation: 424 R Axis:   -65  Text Interpretation: Sinus rhythm with 1st degree A-V block Left anterior fascicular block Left ventricular hypertrophy with QRS widening ( R in aVL , Cornell product , Romhilt-Estes ) T wave abnormality, consider inferior ischemia When compared with ECG of 24-Aug-2008 08:49, No significant change since  last tracing Confirmed by Donato Schultz ([id number]) on 01/21/2024 11:23:47 AM    Results LABS LDL: 116 mg/dL Hb: 57.8 g/dL ALT: 13 U/L  DIAGNOSTIC Echocardiogram: Mild aortic regurgitation, ascending aorta 39 mm, normal mitral valve (01/22/2022) EKG: Normal Risk Assessment/Calculations:           Physical Exam:   VS:  BP (!) 151/72 (BP Location: Left Arm, Patient Position: Sitting, Cuff Size: Normal)   Pulse 68   Resp 16   Ht 5\' 8"  (1.727 m)   Wt 157 lb 9.6 oz (71.5 kg)   SpO2 96%   BMI 23.96 kg/m    Wt Readings from Last 3 Encounters:  01/21/24 157 lb 9.6 oz (71.5 kg)  12/24/22 154 lb 12.8 oz (70.2 kg)  01/02/22 153 lb 12.8 oz (69.8 kg)    GEN: Well nourished, well developed in no acute distress NECK: No JVD; No carotid bruits CARDIAC: RRR, no murmurs, no rubs, no gallops RESPIRATORY:  Clear to auscultation without rales, wheezing or rhonchi  ABDOMEN: Soft, non-tender, non-distended EXTREMITIES:  No edema; No deformity   ASSESSMENT AND PLAN: .    Assessment and Plan Assessment & Plan Aortic regurgitation Moderate to mild aortic regurgitation, well-managed on echocardiogram. Last echocardiogram on January 22, 2022, showed mild aortic regurgitation with an ascending aorta measuring 39 mm.  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 Latest echocardiogram did not mention this nodule, suggesting it is not a current problem.  Primary hypertension Blood pressure elevated at 143 mmHg at home. Currently on irbesartan 75 mg, prescribed by Dr. Tenny Craw. Plan to increase dose to 150 mg to improve blood pressure control. - Increase irbesartan to 150 mg. - Pam will send the prescription for irbesartan. - Dr. Tenny Craw to manage refills.  Tobacco use Reduced smoking to half a pack per day. Continued smoking cessation efforts encouraged.  Cerebrovascular accident (CVA) Stroke in the 1990s  involving the vertebral artery. On chronic Coumadin therapy, wishes to continue this medication.  Follow-up Overall well-managed conditions. - Schedule follow-up in one year to assess overall health and management of conditions.           Signed, Donato Schultz, MD

## 2024-01-21 NOTE — Patient Instructions (Signed)
 Medication Instructions:  Please increase Irbesartan to 150 mg once a day. Continue all other medications as listed.  *If you need a refill on your cardiac medications before your next appointment, please call your pharmacy*  Follow-Up: At [facility], you and your health needs are our priority.  As part of our continuing mission to provide you with exceptional heart care, our providers are all part of one team.  This team includes your primary Cardiologist (physician) and Advanced Practice Providers or APPs (Physician Assistants and Nurse Practitioners) who all work together to provide you with the care you need, when you need it.  Your next appointment:   1 year(s)  Provider:   Donato Schultz, MD    We recommend signing up for the patient portal called "MyChart".  Sign up information is provided on this After Visit Summary.  MyChart is used to connect with patients for Virtual Visits (Telemedicine).  Patients are able to view lab/test results, encounter notes, upcoming appointments, etc.  Non-urgent messages can be sent to your provider as well.   To learn more about what you can do with MyChart, go to [email or web address].      1st Floor: - Lobby - Registration  - Pharmacy  - Lab - Cafe  2nd Floor: - PV Lab - Diagnostic Testing (echo, CT, nuclear med)  3rd Floor: - Vacant  4th Floor: - TCTS (cardiothoracic surgery) - AFib Clinic - Structural Heart Clinic - Vascular Surgery  - Vascular Ultrasound  5th Floor: - [facility] Cardiology (general and EP) - Clinical Pharmacy for coumadin, hypertension, lipid, weight-loss medications, and med management appointments    Valet parking services will be available as well.

## 2024-03-10 DIAGNOSIS — Z7901 Long term (current) use of anticoagulants: Secondary | ICD-10-CM | POA: Diagnosis not present

## 2024-05-05 DIAGNOSIS — I10 Essential (primary) hypertension: Secondary | ICD-10-CM | POA: Diagnosis not present

## 2024-05-05 DIAGNOSIS — Z862 Personal history of diseases of the blood and blood-forming organs and certain disorders involving the immune mechanism: Secondary | ICD-10-CM | POA: Diagnosis not present

## 2024-05-05 DIAGNOSIS — E7800 Pure hypercholesterolemia, unspecified: Secondary | ICD-10-CM | POA: Diagnosis not present

## 2024-05-05 DIAGNOSIS — Z7901 Long term (current) use of anticoagulants: Secondary | ICD-10-CM | POA: Diagnosis not present

## 2024-05-10 DIAGNOSIS — I10 Essential (primary) hypertension: Secondary | ICD-10-CM | POA: Diagnosis not present

## 2024-05-10 DIAGNOSIS — I059 Rheumatic mitral valve disease, unspecified: Secondary | ICD-10-CM | POA: Diagnosis not present

## 2024-05-10 DIAGNOSIS — E7800 Pure hypercholesterolemia, unspecified: Secondary | ICD-10-CM | POA: Diagnosis not present

## 2024-05-10 DIAGNOSIS — Z7901 Long term (current) use of anticoagulants: Secondary | ICD-10-CM | POA: Diagnosis not present

## 2024-05-10 DIAGNOSIS — M25512 Pain in left shoulder: Secondary | ICD-10-CM | POA: Diagnosis not present

## 2024-05-10 DIAGNOSIS — Z23 Encounter for immunization: Secondary | ICD-10-CM | POA: Diagnosis not present

## 2024-05-10 DIAGNOSIS — Z0000 Encounter for general adult medical examination without abnormal findings: Secondary | ICD-10-CM | POA: Diagnosis not present

## 2024-05-10 DIAGNOSIS — Z6824 Body mass index (BMI) 24.0-24.9, adult: Secondary | ICD-10-CM | POA: Diagnosis not present

## 2024-05-26 DIAGNOSIS — H401123 Primary open-angle glaucoma, left eye, severe stage: Secondary | ICD-10-CM | POA: Diagnosis not present

## 2024-05-26 DIAGNOSIS — H52223 Regular astigmatism, bilateral: Secondary | ICD-10-CM | POA: Diagnosis not present

## 2024-05-26 DIAGNOSIS — H524 Presbyopia: Secondary | ICD-10-CM | POA: Diagnosis not present

## 2024-05-26 DIAGNOSIS — H401112 Primary open-angle glaucoma, right eye, moderate stage: Secondary | ICD-10-CM | POA: Diagnosis not present

## 2024-05-26 DIAGNOSIS — Z961 Presence of intraocular lens: Secondary | ICD-10-CM | POA: Diagnosis not present

## 2024-06-16 DIAGNOSIS — I10 Essential (primary) hypertension: Secondary | ICD-10-CM | POA: Diagnosis not present

## 2024-06-16 DIAGNOSIS — Z7901 Long term (current) use of anticoagulants: Secondary | ICD-10-CM | POA: Diagnosis not present

## 2024-06-16 DIAGNOSIS — I059 Rheumatic mitral valve disease, unspecified: Secondary | ICD-10-CM | POA: Diagnosis not present

## 2024-07-01 DIAGNOSIS — Z23 Encounter for immunization: Secondary | ICD-10-CM | POA: Diagnosis not present

## 2024-07-18 DIAGNOSIS — Z23 Encounter for immunization: Secondary | ICD-10-CM | POA: Diagnosis not present

## 2024-08-04 DIAGNOSIS — Z6824 Body mass index (BMI) 24.0-24.9, adult: Secondary | ICD-10-CM | POA: Diagnosis not present

## 2024-08-04 DIAGNOSIS — Z7901 Long term (current) use of anticoagulants: Secondary | ICD-10-CM | POA: Diagnosis not present

## 2024-08-04 DIAGNOSIS — I10 Essential (primary) hypertension: Secondary | ICD-10-CM | POA: Diagnosis not present
